# Patient Record
Sex: Male | Born: 1975
Health system: Southern US, Community
[De-identification: ages and names within clinical notes are randomized; demographics above are authoritative.]

## PROBLEM LIST (undated history)

## (undated) ENCOUNTER — Emergency Department (HOSPITAL_COMMUNITY): Discharge status: Absent without leave | Payer: BLUE CROSS/BLUE SHIELD

## (undated) DIAGNOSIS — E079 Disorder of thyroid, unspecified: Secondary | ICD-10-CM

## (undated) DIAGNOSIS — F319 Bipolar disorder, unspecified: Secondary | ICD-10-CM

## (undated) DIAGNOSIS — E785 Hyperlipidemia, unspecified: Secondary | ICD-10-CM

## (undated) DIAGNOSIS — F329 Major depressive disorder, single episode, unspecified: Secondary | ICD-10-CM

## (undated) DIAGNOSIS — E039 Hypothyroidism, unspecified: Secondary | ICD-10-CM

## (undated) DIAGNOSIS — F32A Depression, unspecified: Secondary | ICD-10-CM

## (undated) DIAGNOSIS — I10 Essential (primary) hypertension: Secondary | ICD-10-CM

## (undated) DIAGNOSIS — T7840XA Allergy, unspecified, initial encounter: Secondary | ICD-10-CM

## (undated) DIAGNOSIS — M1990 Unspecified osteoarthritis, unspecified site: Secondary | ICD-10-CM

## (undated) DIAGNOSIS — F1910 Other psychoactive substance abuse, uncomplicated: Secondary | ICD-10-CM

## (undated) DIAGNOSIS — F419 Anxiety disorder, unspecified: Secondary | ICD-10-CM

## (undated) HISTORY — DX: Disorder of thyroid, unspecified: E07.9

## (undated) HISTORY — DX: Allergy, unspecified, initial encounter: T78.40XA

## (undated) HISTORY — DX: Essential (primary) hypertension: I10

## (undated) HISTORY — DX: Hyperlipidemia, unspecified: E78.5

## (undated) HISTORY — DX: Bipolar disorder, unspecified: F31.9

## (undated) HISTORY — PX: SPINE SURGERY: SHX786

## (undated) HISTORY — PX: OTHER SURGICAL HISTORY: SHX169

## (undated) HISTORY — PX: CARPAL TUNNEL WITH CUBITAL TUNNEL: SHX5608

## (undated) HISTORY — PX: CERVICAL DISC ARTHROPLASTY: SHX587

## (undated) HISTORY — DX: Other psychoactive substance abuse, uncomplicated: F19.10

## (undated) HISTORY — PX: MOUTH SURGERY: SHX715

---
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
## 2009-06-23 ENCOUNTER — Ambulatory Visit: Payer: Self-pay | Admitting: Internal Medicine

## 2009-07-07 ENCOUNTER — Ambulatory Visit: Payer: Self-pay | Admitting: Internal Medicine

## 2009-08-06 ENCOUNTER — Ambulatory Visit: Payer: Self-pay | Admitting: Internal Medicine

## 2009-10-13 ENCOUNTER — Encounter: Admission: RE | Admit: 2009-10-13 | Discharge: 2009-10-13 | Payer: Self-pay | Admitting: Internal Medicine

## 2009-10-15 ENCOUNTER — Ambulatory Visit: Payer: Self-pay | Admitting: Internal Medicine

## 2010-04-20 ENCOUNTER — Ambulatory Visit: Payer: Self-pay | Admitting: Internal Medicine

## 2010-10-25 ENCOUNTER — Ambulatory Visit (HOSPITAL_COMMUNITY)
Admission: RE | Admit: 2010-10-25 | Discharge: 2010-10-25 | Payer: Self-pay | Source: Home / Self Care | Attending: Internal Medicine | Admitting: Internal Medicine

## 2010-10-25 ENCOUNTER — Encounter: Payer: Self-pay | Admitting: Internal Medicine

## 2010-10-25 ENCOUNTER — Ambulatory Visit: Payer: Self-pay | Admitting: Internal Medicine

## 2011-03-18 ENCOUNTER — Emergency Department (HOSPITAL_COMMUNITY): Payer: BC Managed Care – PPO

## 2011-03-18 ENCOUNTER — Emergency Department (HOSPITAL_COMMUNITY)
Admission: EM | Admit: 2011-03-18 | Discharge: 2011-03-18 | Disposition: A | Discharge status: Home / Self Care | Payer: BC Managed Care – PPO | Attending: Emergency Medicine | Admitting: Emergency Medicine

## 2011-03-18 DIAGNOSIS — S0100XA Unspecified open wound of scalp, initial encounter: Secondary | ICD-10-CM | POA: Insufficient documentation

## 2011-03-18 DIAGNOSIS — R748 Abnormal levels of other serum enzymes: Secondary | ICD-10-CM | POA: Insufficient documentation

## 2011-03-18 DIAGNOSIS — R296 Repeated falls: Secondary | ICD-10-CM | POA: Insufficient documentation

## 2011-03-18 DIAGNOSIS — F329 Major depressive disorder, single episode, unspecified: Secondary | ICD-10-CM | POA: Insufficient documentation

## 2011-03-18 DIAGNOSIS — R569 Unspecified convulsions: Secondary | ICD-10-CM | POA: Insufficient documentation

## 2011-03-18 DIAGNOSIS — Z79899 Other long term (current) drug therapy: Secondary | ICD-10-CM | POA: Insufficient documentation

## 2011-03-18 DIAGNOSIS — Y990 Civilian activity done for income or pay: Secondary | ICD-10-CM | POA: Insufficient documentation

## 2011-03-18 DIAGNOSIS — S0990XA Unspecified injury of head, initial encounter: Secondary | ICD-10-CM | POA: Insufficient documentation

## 2011-03-18 DIAGNOSIS — E785 Hyperlipidemia, unspecified: Secondary | ICD-10-CM | POA: Insufficient documentation

## 2011-03-18 DIAGNOSIS — E039 Hypothyroidism, unspecified: Secondary | ICD-10-CM | POA: Insufficient documentation

## 2011-03-18 DIAGNOSIS — R110 Nausea: Secondary | ICD-10-CM | POA: Insufficient documentation

## 2011-03-18 DIAGNOSIS — R413 Other amnesia: Secondary | ICD-10-CM | POA: Insufficient documentation

## 2011-03-18 DIAGNOSIS — I10 Essential (primary) hypertension: Secondary | ICD-10-CM | POA: Insufficient documentation

## 2011-03-18 DIAGNOSIS — F3289 Other specified depressive episodes: Secondary | ICD-10-CM | POA: Insufficient documentation

## 2011-03-18 LAB — URINALYSIS, ROUTINE W REFLEX MICROSCOPIC
Glucose, UA: NEGATIVE mg/dL
Ketones, ur: 15 mg/dL — AB
Leukocytes, UA: NEGATIVE
Nitrite: NEGATIVE
Specific Gravity, Urine: 1.033 — ABNORMAL HIGH (ref 1.005–1.030)
pH: 5.5 (ref 5.0–8.0)

## 2011-03-18 LAB — DIFFERENTIAL
Basophils Absolute: 0 10*3/uL (ref 0.0–0.1)
Basophils Relative: 0 % (ref 0–1)
Eosinophils Absolute: 0.2 10*3/uL (ref 0.0–0.7)
Monocytes Relative: 13 % — ABNORMAL HIGH (ref 3–12)
Neutro Abs: 6.2 10*3/uL (ref 1.7–7.7)
Neutrophils Relative %: 74 % (ref 43–77)

## 2011-03-18 LAB — COMPREHENSIVE METABOLIC PANEL
ALT: 26 U/L (ref 0–53)
AST: 61 U/L — ABNORMAL HIGH (ref 0–37)
Calcium: 10.4 mg/dL (ref 8.4–10.5)
Creatinine, Ser: 0.8 mg/dL (ref 0.4–1.5)
GFR calc Af Amer: >60 mL/min (ref >60)
Sodium: 138 mEq/L (ref 135–145)
Total Protein: 7.1 g/dL (ref 6.0–8.3)

## 2011-03-18 LAB — URINE MICROSCOPIC-ADD ON

## 2011-03-18 LAB — CBC
Hemoglobin: 14.2 g/dL (ref 13.0–17.0)
Platelets: 128 10*3/uL — ABNORMAL LOW (ref 150–400)
RBC: 4.39 MIL/uL (ref 4.22–5.81)
WBC: 8.5 10*3/uL (ref 4.0–10.5)

## 2011-03-18 LAB — RAPID URINE DRUG SCREEN, HOSP PERFORMED
Cocaine: NOT DETECTED
Tetrahydrocannabinol: NOT DETECTED

## 2011-03-18 LAB — GLUCOSE, CAPILLARY: Glucose-Capillary: 101 mg/dL — ABNORMAL HIGH (ref 70–99)

## 2011-03-18 LAB — MAGNESIUM: Magnesium: 2.3 mg/dL (ref 1.5–2.5)

## 2011-03-21 ENCOUNTER — Telehealth: Payer: Self-pay | Admitting: Internal Medicine

## 2011-03-21 NOTE — Telephone Encounter (Signed)
No- that is their time frame and actually that is quite good. Usually takes several weeks to get an apointment

## 2011-03-22 ENCOUNTER — Telehealth: Payer: Self-pay | Admitting: *Deleted

## 2011-03-22 NOTE — Telephone Encounter (Signed)
Left message for pt to call office.  MD wants pt to stop taking Wellbutrin if he had a seizure, since this medication has been associated with seizures.

## 2011-03-22 NOTE — Telephone Encounter (Signed)
Spoke with patient and he is no longer taking Wellbutrin.

## 2011-03-29 ENCOUNTER — Ambulatory Visit (INDEPENDENT_AMBULATORY_CARE_PROVIDER_SITE_OTHER): Payer: BC Managed Care – PPO | Admitting: Internal Medicine

## 2011-03-29 ENCOUNTER — Encounter: Payer: Self-pay | Admitting: Internal Medicine

## 2011-03-29 DIAGNOSIS — Z79899 Other long term (current) drug therapy: Secondary | ICD-10-CM

## 2011-03-29 DIAGNOSIS — G40909 Epilepsy, unspecified, not intractable, without status epilepticus: Secondary | ICD-10-CM

## 2011-03-29 DIAGNOSIS — E785 Hyperlipidemia, unspecified: Secondary | ICD-10-CM

## 2011-03-29 DIAGNOSIS — F329 Major depressive disorder, single episode, unspecified: Secondary | ICD-10-CM

## 2011-03-29 DIAGNOSIS — E039 Hypothyroidism, unspecified: Secondary | ICD-10-CM

## 2011-03-29 DIAGNOSIS — F32A Depression, unspecified: Secondary | ICD-10-CM | POA: Insufficient documentation

## 2011-03-29 DIAGNOSIS — I10 Essential (primary) hypertension: Secondary | ICD-10-CM

## 2011-03-29 LAB — LIPID PANEL
HDL: 45 mg/dL (ref >39)
Total CHOL/HDL Ratio: 3.7 Ratio
Triglycerides: 94 mg/dL (ref <150)

## 2011-03-29 LAB — HEPATIC FUNCTION PANEL
ALT: 18 U/L (ref 0–53)
AST: 28 U/L (ref 0–37)
Albumin: 4.4 g/dL (ref 3.5–5.2)
Alkaline Phosphatase: 74 U/L (ref 39–117)
Total Bilirubin: 0.3 mg/dL (ref 0.3–1.2)
Total Protein: 6.5 g/dL (ref 6.0–8.3)

## 2011-03-29 LAB — TSH: TSH: 4.027 u[IU]/mL (ref 0.350–4.500)

## 2011-03-29 NOTE — Progress Notes (Signed)
  Subjective:    Patient ID: Matthew Novak, male    DOB: [birth year], 35 y.o.   MRN: [id number]  HPI  Pt suffered a generalized seizure at work recently and was taken to ER where he was noted to have a laceration right parietal area that was repaired with staples. CT of brain was negative and he saw neurologist last week who has ordered an MRI and EEG in the mere future. He is not on any seizure meds at present. Last week, we discontinued his Wellbutrin after learning of his seizure. He is still on Lexapro. Also, is due for followup on hypothyroidism and hyperlipidemia so tsh, fasting lipid panel and liver functions drawn oday. Pt denies recent drug or alcohol use or ETOH withdrawal that would have led to seizure. No recent or remote head trauma.    Review of Systems     Objective:   Physical Exam   Right parietal area has several staples  with apprx well healed 3 inch laceration. Staples removed without difficulty .     Assessment & Plan:  1- New onset seizure disorder with laceration right parietal area secondary to striking head with seizure. Initial w/u negative. Neuro eval inprogress 2-HTN- well controlled on current regimen 3-Hyperlipidemia-fasting lipid panel and liver functions pending 4-Hypothyroidism-TSH pending 5-Hx depression and possible Bipolar disorder RTC in 6 months for CPE and fasting labs. Stop Wellbutri

## 2011-03-29 NOTE — Patient Instructions (Signed)
Stop Wellbutrin. Continue Lexapro. Continue thyroid replacement and statin therapy as well as anti-hypertensive meds. See in 6 months. Continue followup with Neurologist. No alcohol.

## 2011-03-30 ENCOUNTER — Other Ambulatory Visit: Payer: Self-pay

## 2011-03-30 MED ORDER — SYNTHROID 100 MCG PO TABS: 100.0000 ug | ORAL_TABLET | Freq: Every day | ORAL | Status: DC

## 2011-05-19 ENCOUNTER — Other Ambulatory Visit: Payer: Self-pay | Admitting: *Deleted

## 2011-05-19 MED ORDER — RAMIPRIL 5 MG PO CAPS: 5.0000 mg | ORAL_CAPSULE | Freq: Every day | ORAL | Status: DC

## 2011-05-19 MED ORDER — SIMVASTATIN 20 MG PO TABS
20.0000 mg | ORAL_TABLET | Freq: Every evening | ORAL | Status: DC
Start: 2011-05-19 — End: 2011-10-22

## 2011-07-06 ENCOUNTER — Other Ambulatory Visit: Payer: Self-pay

## 2011-07-06 MED ORDER — NAPROXEN 500 MG PO TABS: 500.0000 mg | ORAL_TABLET | Freq: Two times a day (BID) | ORAL | Status: DC | PRN

## 2011-07-14 ENCOUNTER — Other Ambulatory Visit: Payer: Self-pay | Admitting: *Deleted

## 2011-07-14 MED ORDER — ESCITALOPRAM OXALATE 10 MG PO TABS: 10.0000 mg | ORAL_TABLET | Freq: Every day | ORAL | Status: DC

## 2011-07-15 ENCOUNTER — Encounter: Payer: Self-pay | Admitting: Internal Medicine

## 2011-07-19 ENCOUNTER — Encounter: Payer: Self-pay | Admitting: Internal Medicine

## 2011-07-19 ENCOUNTER — Ambulatory Visit (INDEPENDENT_AMBULATORY_CARE_PROVIDER_SITE_OTHER): Payer: BC Managed Care – PPO | Admitting: Internal Medicine

## 2011-07-19 VITALS — BP 118/78 | HR 72 | Temp 98.0°F | Ht 69.0 in | Wt 179.0 lb

## 2011-07-19 DIAGNOSIS — I10 Essential (primary) hypertension: Secondary | ICD-10-CM

## 2011-07-19 DIAGNOSIS — F10239 Alcohol dependence with withdrawal, unspecified: Secondary | ICD-10-CM

## 2011-07-19 DIAGNOSIS — F1010 Alcohol abuse, uncomplicated: Secondary | ICD-10-CM

## 2011-07-19 DIAGNOSIS — R569 Unspecified convulsions: Secondary | ICD-10-CM

## 2011-07-19 DIAGNOSIS — F10939 Alcohol use, unspecified with withdrawal, unspecified: Secondary | ICD-10-CM

## 2011-07-19 DIAGNOSIS — E039 Hypothyroidism, unspecified: Secondary | ICD-10-CM

## 2011-07-19 DIAGNOSIS — E785 Hyperlipidemia, unspecified: Secondary | ICD-10-CM

## 2011-07-19 NOTE — Progress Notes (Signed)
  Subjective:    Patient ID: Matthew Novak, male    DOB: Aug 21, 1976, 35 y.o.   MRN: [id number]  HPI  patient with history of hypertension hyperlipidemia and hypothyroidism had tonic-clonic seizure 03/18/2011. He went to the emergency department and had a CT of the head and neck which were negative. His AST was elevated but other blood work was negative. Urine tested positive for amphetamines. Patient saw Dr.Dohmeier, neurologist 07/14/2011. He admitted that he has a drinking problem and has been consuming 2-4 drinks of rum at night. He apparently is allergic to contrast x-ray dye. Neurologist suggested an EEG and an MRI without contrast. He also had a sleep study recently that showed only mild apnea but he was intoxicated during his sleep study. He smokes a pack of cigarettes per week. 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 He is married and has one son. Wife works for [employer]. Patient also has been taking Lexapro for history of depression and possible bipolar disorder. He is on Zocor, Altase, Synthroid. Also at one point was on trazodone 100 mg at bedtime and 2010. Also at one point took Wellbutrin 300 mg daily. Most recent dose of Lexapro was 20 mg daily in 2010. Patient apparently tried to stop drinking but had some withdrawal symptoms. Neurologist has referred him to Dr. Sharyon Medicus. Neurologist says he can return to driving in early November. She counseled him at length about the need for entering alcoholics anonymous. 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 After neurologist call me last week, I asked patient and his wife to come see me today.  Wife was interviewed separately. Says that she did not realize until recently that this event, significant problem. She is concerned because her father also has alcohol dependency issues. Says finances are tight. She is stressed out herself. They have been together 17  years.  The in patient and his wife were interviewed together in an intervention was attempted. I spoke with him about the dangers of taking Klonopin which was prescribed by a neurologist for alcohol withdrawal symptoms and continuing to drink. He expressed my concern about his health and alcohol usage. In my opinion, he needs inpatient treatment with detox. He and his wife are going to check into their insurance plan and see if that is possible. My staff will make call to [facility] to see if a bed is available. Patient has not been to an AA meeting. Wife was referred to Al-Anon.    Review of Systems     Objective:   Physical Exam patient is not tremulous. Vital signs are stable. Chest clear, cardiac exam regular rate and rhythm. Patient admits to having one drink last night.        Assessment & Plan:  Alcohol abuse  Alcohol withdrawal seizure  Hypertension  Hyperlipidemia  Hypothyroidism  Family history of alcohol abuse in his father  Possible history of bipolar disorder formerly seen by Dr. Evelene Croon  Plan: Await callback from [facility] regarding inpatient treatment and from patient and his wife regarding substance abuse coverage with their insurance policy

## 2011-09-06 ENCOUNTER — Telehealth: Payer: Self-pay | Admitting: Internal Medicine

## 2011-09-06 NOTE — Telephone Encounter (Signed)
Please call patient re Klonopin question he has. He has hx ETOH abuse. Neurologist treated him recently for alcohol withdrawal and seizure.Marland Kitchen He was supposed to get some help with addiction. Not sure he followed through.

## 2011-09-07 NOTE — Telephone Encounter (Signed)
Patient states he has not had any ETOH since last ov with Dr. Lenord Fellers. Apparently quit cold [place] without any formal program or meetings

## 2011-09-12 ENCOUNTER — Ambulatory Visit (INDEPENDENT_AMBULATORY_CARE_PROVIDER_SITE_OTHER): Payer: BC Managed Care – PPO | Admitting: Internal Medicine

## 2011-09-12 ENCOUNTER — Encounter: Payer: Self-pay | Admitting: Internal Medicine

## 2011-09-12 VITALS — BP 112/68 | HR 68 | Temp 98.0°F | Ht 69.0 in | Wt 184.0 lb

## 2011-09-12 DIAGNOSIS — F32A Depression, unspecified: Secondary | ICD-10-CM

## 2011-09-12 DIAGNOSIS — E785 Hyperlipidemia, unspecified: Secondary | ICD-10-CM

## 2011-09-12 DIAGNOSIS — F329 Major depressive disorder, single episode, unspecified: Secondary | ICD-10-CM

## 2011-09-12 DIAGNOSIS — F419 Anxiety disorder, unspecified: Secondary | ICD-10-CM

## 2011-09-12 DIAGNOSIS — Z79899 Other long term (current) drug therapy: Secondary | ICD-10-CM

## 2011-09-12 DIAGNOSIS — F1010 Alcohol abuse, uncomplicated: Secondary | ICD-10-CM

## 2011-09-12 DIAGNOSIS — Z0000 Encounter for general adult medical examination without abnormal findings: Secondary | ICD-10-CM

## 2011-09-12 DIAGNOSIS — Z23 Encounter for immunization: Secondary | ICD-10-CM

## 2011-09-12 DIAGNOSIS — I10 Essential (primary) hypertension: Secondary | ICD-10-CM

## 2011-09-12 DIAGNOSIS — E039 Hypothyroidism, unspecified: Secondary | ICD-10-CM

## 2011-09-12 LAB — CBC WITH DIFFERENTIAL/PLATELET
Eosinophils Absolute: 0.4 10*3/uL (ref 0.0–0.7)
HCT: 44.5 % (ref 39.0–52.0)
Hemoglobin: 15 g/dL (ref 13.0–17.0)
Lymphs Abs: 2 10*3/uL (ref 0.7–4.0)
MCH: 30.1 pg (ref 26.0–34.0)
Monocytes Absolute: 0.7 10*3/uL (ref 0.1–1.0)
Monocytes Relative: 12 % (ref 3–12)
Neutro Abs: 2.8 10*3/uL (ref 1.7–7.7)
Neutrophils Relative %: 47 % (ref 43–77)
RBC: 4.98 MIL/uL (ref 4.22–5.81)

## 2011-09-12 LAB — TSH: TSH: 1.238 u[IU]/mL (ref 0.350–4.500)

## 2011-09-12 LAB — LIPID PANEL
Cholesterol: 166 mg/dL (ref 0–200)
HDL: 35 mg/dL — ABNORMAL LOW (ref >39)
Total CHOL/HDL Ratio: 4.7 Ratio
VLDL: 16 mg/dL (ref 0–40)

## 2011-09-12 LAB — POCT URINALYSIS DIPSTICK
Blood, UA: NEGATIVE
Nitrite, UA: NEGATIVE
Protein, UA: NEGATIVE
Spec Grav, UA: 1.01
Urobilinogen, UA: 0.2
pH, UA: 7.5

## 2011-09-12 LAB — COMPREHENSIVE METABOLIC PANEL
AST: 21 U/L (ref 0–37)
Albumin: 4.6 g/dL (ref 3.5–5.2)
Alkaline Phosphatase: 68 U/L (ref 39–117)
BUN: 15 mg/dL (ref 6–23)
Creat: 0.76 mg/dL (ref 0.50–1.35)
Glucose, Bld: 98 mg/dL (ref 70–99)
Potassium: 4.4 mEq/L (ref 3.5–5.3)
Total Bilirubin: 0.5 mg/dL (ref 0.3–1.2)

## 2011-09-12 NOTE — Progress Notes (Signed)
  Subjective:    Patient ID: Matthew Novak, male    DOB: November 03, 1976, 35 y.o.   MRN: [id number]  HPI 35 year old white male with history of hypertension, hyperlipidemia, hypothyroidism, anxiety depression, alcohol abuse for health maintenance. In may 2012, he had a one-time tonic-clonic seizure related to alcohol withdrawal. Was seen by a neurologist and was placed on Klonopin 2 mg one half tablet twice daily which she says helped greatly with anxiety. He did not seek help through Alcoholics Anonymous for [facility] is I suggested. Says he has stopped drinking and has not been consuming any alcohol now for several months. Says he gets anxious sometimes. Has trouble sleeping. Wife says sometimes he has a temper. Wants to be back on Klonopin. Says it helped his mood and help him to sleep. He is also on Lexapro.    Review of Systems  Constitutional: Negative.   HENT: Negative.   Eyes: Negative.   Cardiovascular: Negative.   Gastrointestinal: Negative.   Genitourinary: Negative.   Musculoskeletal: Negative.   Skin: Negative.   Neurological: Negative.   Hematological: Negative.   Psychiatric/Behavioral: Positive for sleep disturbance, dysphoric mood and agitation.       Objective:   Physical Exam  Vitals reviewed. Constitutional: He is oriented to person, place, and time. He appears well-developed and well-nourished.  HENT:  Head: Normocephalic and atraumatic.  Right Ear: External ear normal.  Left Ear: External ear normal.  Mouth/Throat: Oropharynx is clear and moist.  Eyes: Pupils are equal, round, and reactive to light.  Neck: Normal range of motion. Neck supple. No JVD present. No thyromegaly present.  Cardiovascular: Normal rate, regular rhythm and normal heart sounds.   No murmur heard. Pulmonary/Chest: Effort normal and breath sounds normal. He has no wheezes. He has no rales.  Abdominal: Soft. Bowel sounds are normal. He exhibits no mass. There is no tenderness.    Genitourinary:       Deferred  Musculoskeletal: Normal range of motion. He exhibits no edema.  Lymphadenopathy:    He has no cervical adenopathy.  Neurological: He is alert and oriented to person, place, and time. He has normal reflexes. No cranial nerve deficit. Coordination normal.  Skin: Skin is warm and dry.       Has a tattoo on each arm and each leg  Psychiatric: He has a normal mood and affect. Judgment and thought content normal.          Assessment & Plan:  Hypertension  Hyperlipidemia  Hypothyroidism  Anxiety depression  History of alcohol abuse with alcohol withdrawal seizure  Plan continue Lexapro 20 mg daily, fasting labs drawn and pending including TSH and fasting lipid panel. Prescription for Klonopin 1 mg #60 one by mouth twice daily with 2 refills. See him again in 6 months at which time she'll need fasting lipid panel liver functions and TSH along with office visit. Influenza immunization given today.

## 2011-09-12 NOTE — Patient Instructions (Signed)
Take Klonopin twice daily. Do not exceed that amount. Return in 6 months. Continue with medication for hypertension, hypothyroidism, hyperlipidemia. Continue with Lexapro.

## 2011-10-12 ENCOUNTER — Other Ambulatory Visit: Payer: Self-pay | Admitting: Internal Medicine

## 2011-10-22 ENCOUNTER — Other Ambulatory Visit: Payer: Self-pay | Admitting: Internal Medicine

## 2011-11-24 ENCOUNTER — Other Ambulatory Visit: Payer: Self-pay

## 2011-11-24 MED ORDER — SYNTHROID 100 MCG PO TABS: 100.0000 ug | ORAL_TABLET | Freq: Every day | ORAL | Status: DC

## 2012-01-06 ENCOUNTER — Encounter: Payer: Self-pay | Admitting: Internal Medicine

## 2012-01-06 ENCOUNTER — Ambulatory Visit (INDEPENDENT_AMBULATORY_CARE_PROVIDER_SITE_OTHER): Payer: BC Managed Care – PPO | Admitting: Internal Medicine

## 2012-01-06 DIAGNOSIS — E785 Hyperlipidemia, unspecified: Secondary | ICD-10-CM

## 2012-01-06 DIAGNOSIS — F419 Anxiety disorder, unspecified: Secondary | ICD-10-CM

## 2012-01-06 DIAGNOSIS — E039 Hypothyroidism, unspecified: Secondary | ICD-10-CM

## 2012-01-06 DIAGNOSIS — Z79899 Other long term (current) drug therapy: Secondary | ICD-10-CM

## 2012-01-06 DIAGNOSIS — R569 Unspecified convulsions: Secondary | ICD-10-CM

## 2012-01-06 DIAGNOSIS — F411 Generalized anxiety disorder: Secondary | ICD-10-CM

## 2012-01-06 DIAGNOSIS — I10 Essential (primary) hypertension: Secondary | ICD-10-CM

## 2012-01-06 DIAGNOSIS — F1011 Alcohol abuse, in remission: Secondary | ICD-10-CM

## 2012-01-06 DIAGNOSIS — F10239 Alcohol dependence with withdrawal, unspecified: Secondary | ICD-10-CM

## 2012-01-06 LAB — HEPATIC FUNCTION PANEL
Albumin: 4.3 g/dL (ref 3.5–5.2)
Alkaline Phosphatase: 74 U/L (ref 39–117)
Bilirubin, Direct: 0.1 mg/dL (ref 0.0–0.3)
Indirect Bilirubin: 0.2 mg/dL (ref 0.0–0.9)
Total Bilirubin: 0.3 mg/dL (ref 0.3–1.2)

## 2012-01-06 LAB — LIPID PANEL: LDL Cholesterol: 112 mg/dL — ABNORMAL HIGH (ref 0–99)

## 2012-01-08 NOTE — Patient Instructions (Signed)
Discontinue Klonopin. Take Valium 1/2-1 tablet by mouth twice daily as needed for anxiety. Do not stop Valium abruptly. Continue [place] Zocor. Continue trazodone. Return in 6 months for physical exam or sooner if necessary.

## 2012-01-08 NOTE — Progress Notes (Signed)
  Subjective:    Patient ID: Matthew Novak, male    DOB: 19-Nov-1975, 36 y.o.   MRN: [id number]  HPI Patient says Scarlette [place] is making him very drowsy. He wants to come off of that. However it seems to been successful in controlling anxiety. Says he's not had an alcoholic drink in 6 months. Does have history of alcohol withdrawal seizure. 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 He also has a history of hypertension and hypothyroidism as well as hyperlipidemia. He says he's previously been diagnosed with bipolar disorder. He is on Zocor, Altase, Lexapro, Synthroid and Klonopin 1 mg twice daily. Started on Klonopin 1 mg by mouth twice daily November 2012. Diagnosed with hypothyroidism in 2010. Diagnosed with hypertension in 2010. Is also taking trazodone 100 mg at bedtime. Says he has trouble getting up and going to work in the mornings because of extreme drowsiness. Trazodone may be contributing to that as well.    Review of Systems     Objective:   Physical Exam chest clear to auscultation. Cardiac exam regular rate and rhythm normal S1 and S2. Neck is supple without thyromegaly.        Assessment & Plan:  History of alcohol abuse  History of alcohol withdrawal seizure  History of anxiety  Hypertension  Hypothyroidism  Hyperlipidemia  Plan: Fasting lab work drawn today including lipid panel liver functions and TSH. Patient really wants to come off of Klonopin. I think it would be a mistake just to stop that abruptly. I do think he probably benefits from some anti-anxiety medication. Have prescribed Valium 5 mg by mouth twice daily. Suggested maybe just taking one half to one tablet in the morning and maybe not at night. Trazodone may be contributing to drowsiness in the morning as well but I think it helps with mood stabilization. He is to return in 6 months for physical exam.

## 2012-02-29 ENCOUNTER — Other Ambulatory Visit: Payer: Self-pay

## 2012-02-29 MED ORDER — DIAZEPAM 5 MG PO TABS: 5.0000 mg | ORAL_TABLET | Freq: Two times a day (BID) | ORAL | Status: AC | PRN

## 2012-03-12 ENCOUNTER — Ambulatory Visit: Payer: BC Managed Care – PPO | Admitting: Internal Medicine

## 2012-03-16 ENCOUNTER — Other Ambulatory Visit: Payer: Self-pay | Admitting: Internal Medicine

## 2012-03-29 ENCOUNTER — Other Ambulatory Visit: Payer: Self-pay | Admitting: Internal Medicine

## 2012-03-30 ENCOUNTER — Other Ambulatory Visit: Payer: Self-pay

## 2012-03-30 MED ORDER — SIMVASTATIN 20 MG PO TABS: 20.0000 mg | ORAL_TABLET | Freq: Every day | ORAL | Status: DC

## 2012-08-05 ENCOUNTER — Other Ambulatory Visit: Payer: Self-pay | Admitting: Internal Medicine

## 2012-08-06 ENCOUNTER — Other Ambulatory Visit: Payer: Self-pay

## 2012-08-06 MED ORDER — SYNTHROID 100 MCG PO TABS: 100.0000 ug | ORAL_TABLET | Freq: Every day | ORAL | Status: DC

## 2012-08-06 MED ORDER — ESCITALOPRAM OXALATE 10 MG PO TABS: 10.0000 mg | ORAL_TABLET | Freq: Every day | ORAL | Status: DC

## 2012-08-16 ENCOUNTER — Other Ambulatory Visit: Payer: Self-pay | Admitting: Internal Medicine

## 2012-09-03 ENCOUNTER — Other Ambulatory Visit: Payer: Self-pay

## 2012-09-03 MED ORDER — NAPROXEN 500 MG PO TABS: 500.0000 mg | ORAL_TABLET | Freq: Two times a day (BID) | ORAL | Status: DC | PRN

## 2012-09-17 ENCOUNTER — Other Ambulatory Visit: Payer: BC Managed Care – PPO | Admitting: Internal Medicine

## 2012-09-18 ENCOUNTER — Encounter: Payer: BC Managed Care – PPO | Admitting: Internal Medicine

## 2012-10-14 ENCOUNTER — Other Ambulatory Visit: Payer: Self-pay | Admitting: Internal Medicine

## 2012-10-14 NOTE — Telephone Encounter (Signed)
Refill for 30 days and see when OV is due please

## 2012-10-15 ENCOUNTER — Other Ambulatory Visit: Payer: Self-pay

## 2012-10-15 MED ORDER — DIAZEPAM 5 MG PO TABS: 5.0000 mg | ORAL_TABLET | Freq: Two times a day (BID) | ORAL | Status: DC | PRN

## 2012-10-25 ENCOUNTER — Other Ambulatory Visit: Payer: Self-pay | Admitting: Internal Medicine

## 2012-10-25 NOTE — Telephone Encounter (Signed)
We recently refilled. Please check on this and when is appt due

## 2012-10-25 NOTE — Telephone Encounter (Signed)
Left message on patient's cell  Phone to return our call

## 2012-11-02 NOTE — Telephone Encounter (Signed)
Have not heard from patient yet. Left another voicemail message.

## 2012-11-15 ENCOUNTER — Other Ambulatory Visit: Payer: Self-pay

## 2012-11-15 MED ORDER — DIAZEPAM 5 MG PO TABS: 5.0000 mg | ORAL_TABLET | Freq: Two times a day (BID) | ORAL | Status: DC | PRN

## 2012-11-29 ENCOUNTER — Encounter: Payer: BC Managed Care – PPO | Admitting: Internal Medicine

## 2013-01-04 ENCOUNTER — Ambulatory Visit (INDEPENDENT_AMBULATORY_CARE_PROVIDER_SITE_OTHER): Payer: BC Managed Care – PPO | Admitting: Internal Medicine

## 2013-01-04 ENCOUNTER — Encounter: Payer: Self-pay | Admitting: Internal Medicine

## 2013-01-04 ENCOUNTER — Other Ambulatory Visit: Payer: Self-pay | Admitting: Internal Medicine

## 2013-01-04 VITALS — BP 98/70 | HR 76 | Temp 97.8°F | Ht 70.25 in | Wt 176.5 lb

## 2013-01-04 DIAGNOSIS — F419 Anxiety disorder, unspecified: Secondary | ICD-10-CM

## 2013-01-04 DIAGNOSIS — Z0000 Encounter for general adult medical examination without abnormal findings: Secondary | ICD-10-CM

## 2013-01-04 DIAGNOSIS — F1021 Alcohol dependence, in remission: Secondary | ICD-10-CM

## 2013-01-04 DIAGNOSIS — E785 Hyperlipidemia, unspecified: Secondary | ICD-10-CM

## 2013-01-04 DIAGNOSIS — R569 Unspecified convulsions: Secondary | ICD-10-CM

## 2013-01-04 DIAGNOSIS — E039 Hypothyroidism, unspecified: Secondary | ICD-10-CM

## 2013-01-04 DIAGNOSIS — F341 Dysthymic disorder: Secondary | ICD-10-CM

## 2013-01-04 DIAGNOSIS — I10 Essential (primary) hypertension: Secondary | ICD-10-CM

## 2013-01-04 DIAGNOSIS — F32A Depression, unspecified: Secondary | ICD-10-CM

## 2013-01-04 DIAGNOSIS — F10239 Alcohol dependence with withdrawal, unspecified: Secondary | ICD-10-CM

## 2013-01-04 LAB — LIPID PANEL
HDL: 40 mg/dL (ref >39)
LDL Cholesterol: 106 mg/dL — ABNORMAL HIGH (ref 0–99)
Total CHOL/HDL Ratio: 3.9 Ratio

## 2013-01-04 LAB — POCT URINALYSIS DIPSTICK
Blood, UA: NEGATIVE
Nitrite, UA: NEGATIVE
Spec Grav, UA: 1.015
Urobilinogen, UA: NEGATIVE
pH, UA: 7

## 2013-01-04 LAB — CBC WITH DIFFERENTIAL/PLATELET
Hemoglobin: 15.2 g/dL (ref 13.0–17.0)
Lymphocytes Relative: 37 % (ref 12–46)
Lymphs Abs: 2.1 10*3/uL (ref 0.7–4.0)
MCH: 29.5 pg (ref 26.0–34.0)
MCV: 84.7 fL (ref 78.0–100.0)
Monocytes Relative: 11 % (ref 3–12)
Neutrophils Relative %: 43 % (ref 43–77)
Platelets: 248 10*3/uL (ref 150–400)
RBC: 5.15 MIL/uL (ref 4.22–5.81)
WBC: 5.7 10*3/uL (ref 4.0–10.5)

## 2013-01-04 LAB — COMPREHENSIVE METABOLIC PANEL
AST: 18 U/L (ref 0–37)
Alkaline Phosphatase: 60 U/L (ref 39–117)
BUN: 16 mg/dL (ref 6–23)
CO2: 26 mEq/L (ref 19–32)
Chloride: 105 mEq/L (ref 96–112)
Creat: 0.89 mg/dL (ref 0.50–1.35)
Sodium: 140 mEq/L (ref 135–145)
Total Bilirubin: 0.5 mg/dL (ref 0.3–1.2)

## 2013-01-04 LAB — TSH: TSH: 2.008 u[IU]/mL (ref 0.350–4.500)

## 2013-02-18 ENCOUNTER — Encounter: Payer: Self-pay | Admitting: Internal Medicine

## 2013-02-18 ENCOUNTER — Ambulatory Visit (INDEPENDENT_AMBULATORY_CARE_PROVIDER_SITE_OTHER): Payer: Self-pay | Admitting: Internal Medicine

## 2013-02-18 VITALS — BP 116/80 | HR 76 | Temp 98.9°F | Wt 174.0 lb

## 2013-02-18 DIAGNOSIS — Z8679 Personal history of other diseases of the circulatory system: Secondary | ICD-10-CM

## 2013-02-18 DIAGNOSIS — E785 Hyperlipidemia, unspecified: Secondary | ICD-10-CM

## 2013-02-18 DIAGNOSIS — E291 Testicular hypofunction: Secondary | ICD-10-CM

## 2013-02-18 DIAGNOSIS — R5381 Other malaise: Secondary | ICD-10-CM

## 2013-02-18 DIAGNOSIS — F1011 Alcohol abuse, in remission: Secondary | ICD-10-CM

## 2013-02-18 DIAGNOSIS — E039 Hypothyroidism, unspecified: Secondary | ICD-10-CM

## 2013-02-18 DIAGNOSIS — R5383 Other fatigue: Secondary | ICD-10-CM

## 2013-02-18 DIAGNOSIS — R7989 Other specified abnormal findings of blood chemistry: Secondary | ICD-10-CM

## 2013-02-18 DIAGNOSIS — F1911 Other psychoactive substance abuse, in remission: Secondary | ICD-10-CM

## 2013-02-18 LAB — LIPID PANEL
LDL Cholesterol: 149 mg/dL — ABNORMAL HIGH (ref 0–99)
Triglycerides: 121 mg/dL (ref <150)

## 2013-02-19 LAB — TESTOSTERONE: Testosterone: 252 ng/dL — ABNORMAL LOW (ref 300–890)

## 2013-02-26 ENCOUNTER — Other Ambulatory Visit: Payer: Self-pay | Admitting: Internal Medicine

## 2013-02-27 ENCOUNTER — Other Ambulatory Visit: Payer: Self-pay

## 2013-03-09 ENCOUNTER — Encounter: Payer: Self-pay | Admitting: Internal Medicine

## 2013-03-09 DIAGNOSIS — F1021 Alcohol dependence, in remission: Secondary | ICD-10-CM | POA: Insufficient documentation

## 2013-03-09 DIAGNOSIS — R7989 Other specified abnormal findings of blood chemistry: Secondary | ICD-10-CM | POA: Insufficient documentation

## 2013-03-09 NOTE — Patient Instructions (Addendum)
Patient may hold off on TSH and lipid-lowering medication and return in 2 months and have these rechecked. Continue antihypertensive medication.

## 2013-03-09 NOTE — Progress Notes (Signed)
  Subjective:    Patient ID: Matthew Novak, male    DOB: [birth year], 37 y.o.   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 Patient has a history of drug and alcohol abuse but he is in recovery. Has a history of anxiety and depression. At last visit in February he had discontinued taking thyroid replacement medication and lipid-lowering medication. He wanted to return and have these checked in a couple of months to see if they were stable. Is also asking to have his testosterone checked. History of anxiety for which he takes when necessary Valium. Is under some stress with business.    Review of Systems     Objective:   Physical Exam neck is supple without thyromegaly. Chest clear to auscultation. Cardiac exam regular rate and rhythm normal S1 and S2. Extremities without edema        Assessment & Plan:  Fatigue wants to have a testosterone checked  History of hypothyroidism but recent thyroid functions were normal  History of hypertension but has discontinued Ramapril. Since he has stopped drinking he may not need antihypertensive medication. Continue to monitor.  Hyperlipidemia-has stopped cholesterol lowering medication.  History of alcohol withdrawal seizure  Plan: Return in 6 months for followup. If testosterone level is low, refer to urologist  Addendum: Testosterone level is low. Refer to urology

## 2013-03-09 NOTE — Progress Notes (Signed)
Subjective:    Patient ID: Matthew Novak, male    DOB: 17-May-1976, 37 y.o.   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 He has a history of drug and alcohol abuse. He has had 1 alcohol withdrawal seizure in the past. Neurological exam and workup were negative. Had sleep study after alcohol withdrawal seizure but apparently was intoxicated at the time of the study and had some mild apnea. Takes occasional Valium for anxiety. Takes Lexapro for anxiety and depression. History of hypertension for which he takes Altace. Has not been taking thyroid replacement for hypothyroidism nor statin medication for history of hyperlipidemia. Wants to have labs checked and see how these values are off medication. Says he's not been drinking or consume illicit drugs. Feels that depression is stable. He is married has one son. Son has diabetes mellitus. He has had a fair amount of stress on the job because his brother-in-law who also works there has been out due to injury and his father-in-law has had prostate cancer surgery. 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 His mother-in-law helps with bookkeeping.  Has been a patient here since 2010. At that time he presented here he was on Lexapro and Wellbutrin. He had noticed his blood pressure had been elevated. Says that he had seen Dr. Lafayette Dragon, psychiatrist and been diagnosed with bipolar disorder.  Smokes about half pack cigarettes daily. Has smoked for over 20 years.  Is allergic to x-ray contrast dye.  History of tendinitis and reassessed in the past and right shoulder arthropathy.  Family history: Father died at age 64 of ALS with history of MI and hypertension. Mother in her 77s with history of hypertension. Brother in his 66s with history of hypertension.        Review of Systems  Constitutional: Negative.   Psychiatric/Behavioral:       History of  depression and anxiety  All other systems reviewed and are negative.       Objective:   Physical Exam  Vitals reviewed. Constitutional: He is oriented to person, place, and time. He appears well-developed and well-nourished. No distress.  HENT:  Head: Normocephalic and atraumatic.  Right Ear: External ear normal.  Left Ear: External ear normal.  Mouth/Throat: No oropharyngeal exudate.  Eyes: Conjunctivae and EOM are normal. Pupils are equal, round, and reactive to light. Right eye exhibits no discharge. Left eye exhibits no discharge. No scleral icterus.  Neck: Neck supple. No JVD present. No thyromegaly present.  Cardiovascular: Normal rate, regular rhythm, normal heart sounds and intact distal pulses.   No murmur heard. Pulmonary/Chest: Effort normal and breath sounds normal. He has no rales.  Abdominal: Soft. He exhibits no distension and no mass. There is no tenderness. There is no rebound and no guarding.  Musculoskeletal: Normal range of motion. He exhibits no edema.  Lymphadenopathy:    He has no cervical adenopathy.  Neurological: He is alert and oriented to person, place, and time. He has normal reflexes. No cranial nerve deficit.  Skin: Skin is warm and dry. No rash noted.  Piercing beneath lower lip  Psychiatric: He has a normal mood and affect. His behavior is normal. Judgment and thought content normal.          Assessment & Plan:  History of drug and alcohol abuse in 2012 , is in  recovery and appears to be stable  History  of alcohol withdrawal seizure 2012  Hypertension- stable on Altace  History of hyperlipidemia- not taking statin  History of hypothyroidism- not taking thyroid replacement  History of anxiety depression- taking Lexapro  Plan: Fasting labs are drawn and are pending. Further recommendations to follow based on labs. He's not taking Synthroid nor statin medication. He is taking antihypertensive medication. He is taking Lexapro and occasional  Valium  Addendum: Interestingly, TSH is normal off Synthroid and lipid panel is essentially normal off statin medication. Continue antihypertensive medication and Lexapro as well as when necessary value. He is to return in a couple of months and have TSH and lipid panel repeated.

## 2013-03-11 NOTE — Patient Instructions (Addendum)
Will be referred to urology for low testosterone level. Stay off antihypertensive medication, thyroid medication and lipid-lowering medication and return in 6 months for recheck

## 2013-05-03 ENCOUNTER — Other Ambulatory Visit: Payer: Self-pay | Admitting: Internal Medicine

## 2013-05-03 NOTE — Telephone Encounter (Signed)
Refill for 30 days  

## 2013-06-24 ENCOUNTER — Other Ambulatory Visit: Payer: Self-pay | Admitting: Internal Medicine

## 2013-06-25 NOTE — Telephone Encounter (Signed)
Refill once 

## 2013-07-13 ENCOUNTER — Other Ambulatory Visit: Payer: Self-pay | Admitting: Internal Medicine

## 2013-08-06 ENCOUNTER — Other Ambulatory Visit: Payer: Self-pay | Admitting: Internal Medicine

## 2013-08-06 NOTE — Telephone Encounter (Signed)
Refill once 

## 2013-08-30 ENCOUNTER — Other Ambulatory Visit: Payer: Self-pay | Admitting: Internal Medicine

## 2013-10-02 ENCOUNTER — Other Ambulatory Visit: Payer: Self-pay | Admitting: Internal Medicine

## 2013-10-02 ENCOUNTER — Telehealth: Payer: Self-pay | Admitting: Internal Medicine

## 2013-10-02 NOTE — Telephone Encounter (Signed)
Received refill request for Valium generic 5 mg one by mouth twice daily as needed. Called in #60 no refill to [facility]. Patient last seen April 2014. Needs followup visit soon.

## 2013-10-09 NOTE — Telephone Encounter (Signed)
Left message for patient to call me to confirm a 6 month f/u appointment (med check).  Tentatively given appt for 11/15/13 @ 9:45 a.m. And asked that he return my call to confirm date/time.  No additional refills until he is seen for 6 month f/u.

## 2013-11-07 ENCOUNTER — Other Ambulatory Visit: Payer: Self-pay | Admitting: Internal Medicine

## 2013-11-14 ENCOUNTER — Telehealth: Payer: Self-pay | Admitting: Internal Medicine

## 2013-11-14 NOTE — Telephone Encounter (Signed)
Requested patient to please contact me at his earliest convenience to book CPE in March.  Patient verbalized understanding of this request.

## 2013-12-04 ENCOUNTER — Other Ambulatory Visit: Payer: Self-pay | Admitting: Internal Medicine

## 2013-12-16 ENCOUNTER — Encounter: Payer: Self-pay | Admitting: Internal Medicine

## 2013-12-16 ENCOUNTER — Ambulatory Visit (INDEPENDENT_AMBULATORY_CARE_PROVIDER_SITE_OTHER): Payer: BC Managed Care – PPO | Admitting: Internal Medicine

## 2013-12-16 VITALS — BP 120/84 | HR 76 | Temp 98.4°F | Wt 162.5 lb

## 2013-12-16 DIAGNOSIS — F32A Depression, unspecified: Secondary | ICD-10-CM

## 2013-12-16 DIAGNOSIS — F329 Major depressive disorder, single episode, unspecified: Secondary | ICD-10-CM

## 2013-12-16 DIAGNOSIS — F3289 Other specified depressive episodes: Secondary | ICD-10-CM

## 2013-12-16 DIAGNOSIS — G4700 Insomnia, unspecified: Secondary | ICD-10-CM | POA: Insufficient documentation

## 2013-12-16 DIAGNOSIS — F411 Generalized anxiety disorder: Secondary | ICD-10-CM

## 2013-12-16 MED ORDER — DIAZEPAM 5 MG PO TABS: ORAL_TABLET | ORAL | Status: DC

## 2013-12-16 MED ORDER — ESCITALOPRAM OXALATE 10 MG PO TABS: ORAL_TABLET | ORAL | Status: DC

## 2013-12-16 MED ORDER — QUETIAPINE FUMARATE 50 MG PO TABS: 50.0000 mg | ORAL_TABLET | Freq: Every day | ORAL | Status: DC

## 2013-12-16 NOTE — Patient Instructions (Signed)
Takes Seroquel 50 mg at bedtime and call if not sleeping better in 2 weeks. Watch him out of bag of you are taking. Please just take on an as-needed basis. Continue Lexapro. But physical exam in the near future. Thyroid functions are pending.

## 2013-12-16 NOTE — Progress Notes (Addendum)
   Subjective:    Patient ID: [name], male    DOB: 02/22/1976, 38 y.o.   MRN: [id number]  HPI  Job at [employer] is stressful. Several family members have been out on medical leave and unable to work. He has several full-time employees as well. Taking Valium 10 mg twice a day on a daily basis. I am the and who is here that he is to see Dr. Robina Ade for medication a bit concerned about that. He was just supposed to take it on a when necessary basis. Told him I was afraid he might get addicted to Valium and that was disconcerting to me. He took one of his wife's Tylox for some neck pain at night because he complains of tension in his neck. Says he does not sleep well and doesn't ever feel rested. Told him I would not be willing to give him narcotic pain medication for neck and shoulder pain at this point. Reminded him that value was also a muscle relaxant. He is to see Dr.Kaur for medication management. Says he tried several medicines for depression and bipolar disorder but he did not feel that worked very well. He's on Lexapro currently and does fairly well with that. Not taking Synthroid. Does have history of elevated TSH but last time we checked it it was normal off Synthroid. Says he continues to take statin medication. Needs to put physical exam in the near future. We did draw free T4 and TSH today with complaint of anxiety agitation and insomnia.    Review of Systems     Objective:   Physical Exam  Neck is supple without thyromegaly. Chest clear to auscultation. Cardiac exam regular rate and rhythm normal S1 and S2. Extremities without edema      Assessment & Plan:  Anxiety  History of depression  Insomnia  Musculoskeletal neck and shoulder pain  Plan: Seroquel 50 mg at bedtime. Refill Valium 10 mg( #60) one by mouth twice daily as needed with one refill. Refill Lexapro. Book physical examination Spring 2015. Thyroid functions are pending.

## 2013-12-17 LAB — TSH: TSH: 4.482 u[IU]/mL (ref 0.350–4.500)

## 2013-12-17 LAB — T4, FREE: Free T4: 1.33 ng/dL (ref 0.80–1.80)

## 2013-12-19 ENCOUNTER — Other Ambulatory Visit: Payer: Self-pay

## 2013-12-19 MED ORDER — LEVOTHYROXINE SODIUM 50 MCG PO TABS: 50.0000 ug | ORAL_TABLET | Freq: Every day | ORAL | Status: DC

## 2013-12-19 NOTE — Progress Notes (Signed)
Patient informed. 

## 2014-01-16 ENCOUNTER — Other Ambulatory Visit: Payer: Self-pay | Admitting: Internal Medicine

## 2014-01-17 ENCOUNTER — Other Ambulatory Visit: Payer: Self-pay

## 2014-01-17 MED ORDER — QUETIAPINE FUMARATE 50 MG PO TABS: 50.0000 mg | ORAL_TABLET | Freq: Every day | ORAL | Status: DC

## 2014-01-17 NOTE — Telephone Encounter (Signed)
Started on Seroquel 12/16/13. Please call him and see how it is working.

## 2014-01-17 NOTE — Telephone Encounter (Signed)
Patient reports that Seroquel is working great to help him sleep. OK to refill for one month per Dr. Renold Genta

## 2014-02-11 ENCOUNTER — Other Ambulatory Visit: Payer: Self-pay | Admitting: Internal Medicine

## 2014-03-03 ENCOUNTER — Encounter: Payer: Self-pay | Admitting: Internal Medicine

## 2014-03-03 ENCOUNTER — Ambulatory Visit (INDEPENDENT_AMBULATORY_CARE_PROVIDER_SITE_OTHER): Payer: BC Managed Care – PPO | Admitting: Internal Medicine

## 2014-03-03 VITALS — BP 110/78 | HR 76 | Temp 97.4°F | Ht 69.5 in | Wt 164.5 lb

## 2014-03-03 DIAGNOSIS — F419 Anxiety disorder, unspecified: Secondary | ICD-10-CM

## 2014-03-03 DIAGNOSIS — Z87891 Personal history of nicotine dependence: Secondary | ICD-10-CM

## 2014-03-03 DIAGNOSIS — Z0000 Encounter for general adult medical examination without abnormal findings: Secondary | ICD-10-CM

## 2014-03-03 DIAGNOSIS — F329 Major depressive disorder, single episode, unspecified: Secondary | ICD-10-CM

## 2014-03-03 DIAGNOSIS — E039 Hypothyroidism, unspecified: Secondary | ICD-10-CM | POA: Insufficient documentation

## 2014-03-03 DIAGNOSIS — Z130 Encounter for screening for diseases of the blood and blood-forming organs and certain disorders involving the immune mechanism: Secondary | ICD-10-CM

## 2014-03-03 DIAGNOSIS — I10 Essential (primary) hypertension: Secondary | ICD-10-CM

## 2014-03-03 DIAGNOSIS — F341 Dysthymic disorder: Secondary | ICD-10-CM

## 2014-03-03 DIAGNOSIS — F32A Depression, unspecified: Secondary | ICD-10-CM

## 2014-03-03 DIAGNOSIS — Z13220 Encounter for screening for lipoid disorders: Secondary | ICD-10-CM

## 2014-03-03 DIAGNOSIS — G4700 Insomnia, unspecified: Secondary | ICD-10-CM

## 2014-03-03 LAB — COMPREHENSIVE METABOLIC PANEL
ALBUMIN: 4.4 g/dL (ref 3.5–5.2)
ALK PHOS: 53 U/L (ref 39–117)
ALT: 14 U/L (ref 0–53)
AST: 24 U/L (ref 0–37)
BUN: 11 mg/dL (ref 6–23)
CALCIUM: 9.5 mg/dL (ref 8.4–10.5)
CO2: 28 mEq/L (ref 19–32)
CREATININE: 0.84 mg/dL (ref 0.50–1.35)
Chloride: 106 mEq/L (ref 96–112)
Glucose, Bld: 113 mg/dL — ABNORMAL HIGH (ref 70–99)
POTASSIUM: 4.6 meq/L (ref 3.5–5.3)
Sodium: 141 mEq/L (ref 135–145)
Total Bilirubin: 0.5 mg/dL (ref 0.2–1.2)
Total Protein: 6.2 g/dL (ref 6.0–8.3)

## 2014-03-03 LAB — LIPID PANEL
CHOL/HDL RATIO: 3.5 ratio
Cholesterol: 149 mg/dL (ref 0–200)
HDL: 42 mg/dL (ref >39)
LDL CALC: 95 mg/dL (ref 0–99)
TRIGLYCERIDES: 62 mg/dL (ref <150)
VLDL: 12 mg/dL (ref 0–40)

## 2014-03-03 LAB — POCT URINALYSIS DIPSTICK
BILIRUBIN UA: NEGATIVE
Glucose, UA: NEGATIVE
Ketones, UA: NEGATIVE
Leukocytes, UA: NEGATIVE
Nitrite, UA: NEGATIVE
PH UA: 6.5
PROTEIN UA: NEGATIVE
RBC UA: NEGATIVE
Spec Grav, UA: 1.015
Urobilinogen, UA: NEGATIVE

## 2014-03-03 LAB — CBC WITH DIFFERENTIAL/PLATELET
BASOS ABS: 0.1 10*3/uL (ref 0.0–0.1)
BASOS PCT: 1 % (ref 0–1)
EOS PCT: 9 % — AB (ref 0–5)
Eosinophils Absolute: 0.5 10*3/uL (ref 0.0–0.7)
HEMATOCRIT: 43.8 % (ref 39.0–52.0)
HEMOGLOBIN: 15.1 g/dL (ref 13.0–17.0)
Lymphocytes Relative: 39 % (ref 12–46)
Lymphs Abs: 2.2 10*3/uL (ref 0.7–4.0)
MCH: 30 pg (ref 26.0–34.0)
MCHC: 34.5 g/dL (ref 30.0–36.0)
MCV: 86.9 fL (ref 78.0–100.0)
MONOS PCT: 11 % (ref 3–12)
Monocytes Absolute: 0.6 10*3/uL (ref 0.1–1.0)
Neutro Abs: 2.2 10*3/uL (ref 1.7–7.7)
Neutrophils Relative %: 40 % — ABNORMAL LOW (ref 43–77)
Platelets: 224 10*3/uL (ref 150–400)
RBC: 5.04 MIL/uL (ref 4.22–5.81)
RDW: 13.8 % (ref 11.5–15.5)
WBC: 5.6 10*3/uL (ref 4.0–10.5)

## 2014-03-03 LAB — TSH: TSH: 0.598 u[IU]/mL (ref 0.350–4.500)

## 2014-03-03 NOTE — Patient Instructions (Signed)
Continue same medications and return in 6 months. Labs are pending.

## 2014-03-03 NOTE — Progress Notes (Signed)
Subjective:    Patient ID: Matthew Novak, male    DOB: May 29, 1976, 38 y.o.   MRN: [id number]  HPI  38 year old white male in today for health maintenance exam and evaluation of medical issues. In February, TSH was elevated once again and we convinced him to restart thyroid replacement therapy. Currently on Levothroid 0.05 mg daily for fasting labs pending. He has a history of drug and alcohol abuse but has done well recently. He had 1 alcohol withdrawal seizure in the remote past. His neurological exam and workup were negative at that time. This takes Lexapro and Seroquel. Doing well with that regimen. History of hypertension for which she takes Altase. History of tendinitis and right shoulder arthropathy but currently not an issue.  Social history: He is married and has one son 10 years of age. 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 Has a fair amount of stress on the job. Smokes about half pack cigarettes daily. He smoked for over 20 years.  Is allergic to x-ray contrast dye.  Family history: Father died at age 25 of ALS with history of MI and hypertension. Mother living with history of hypertension. Brother living with history of hypertension.    Review of Systems  Constitutional: Negative.   HENT: Negative.   Eyes: Negative.   Respiratory: Negative.   Cardiovascular: Negative.   Gastrointestinal: Negative.   Endocrine:       History of hypothyroidism  Genitourinary:       History of low testosterone  Allergic/Immunologic: Negative.   Psychiatric/Behavioral:       Situational stress with job. History of insomnia doing better with Seroquel       Objective:   Physical Exam  Vitals reviewed. Constitutional: He is oriented to person, place, and time. He appears well-developed and well-nourished.  HENT:  Head: Normocephalic and atraumatic.  Right Ear: External ear normal.  Left Ear: External ear normal.    Mouth/Throat: Oropharynx is clear and moist. No oropharyngeal exudate.  Eyes: Conjunctivae and EOM are normal. Pupils are equal, round, and reactive to light. Right eye exhibits no discharge. Left eye exhibits no discharge. No scleral icterus.  Neck: Neck supple. No JVD present. No thyromegaly present.  Cardiovascular: Normal rate, normal heart sounds and intact distal pulses.   No murmur heard. Pulmonary/Chest: Effort normal and breath sounds normal. No respiratory distress. He has no wheezes. He has no rales. He exhibits no tenderness.  Abdominal: Soft. Bowel sounds are normal. He exhibits no mass. There is no tenderness. There is no rebound and no guarding.  Genitourinary:  Testicles normal. No hernias to direct palpation  Musculoskeletal: Normal range of motion. He exhibits no edema.  Lymphadenopathy:    He has no cervical adenopathy.  Neurological: He is alert and oriented to person, place, and time. He has normal reflexes. He displays normal reflexes. No cranial nerve deficit. Coordination normal.  Skin: Skin is warm and dry. No rash noted. He is not diaphoretic.  Tattoo on right arm. Chin is  pierced. Dark nevus upper anterior mid abdomen reportedly unchanged  Psychiatric: He has a normal mood and affect. His behavior is normal. Judgment and thought content normal.          Assessment & Plan:  Hypertension-stable on current regimen  Insomnia-successfully treated with generic Seroquel  Anxiety-depression treated with Lexapro   Low testosterone  Hypothyroidism-2 months ago restarted thyroid replacement therapy  History of smoking-not willing to quit  Plan: Return in 6 months for office visit and TSH as well as blood pressure check.

## 2014-03-22 ENCOUNTER — Other Ambulatory Visit: Payer: Self-pay | Admitting: Internal Medicine

## 2014-05-31 ENCOUNTER — Other Ambulatory Visit: Payer: Self-pay | Admitting: Internal Medicine

## 2014-06-21 ENCOUNTER — Ambulatory Visit (INDEPENDENT_AMBULATORY_CARE_PROVIDER_SITE_OTHER): Payer: BC Managed Care – PPO | Admitting: Emergency Medicine

## 2014-06-21 VITALS — BP 104/78 | HR 68 | Temp 98.6°F | Resp 16 | Ht 70.0 in | Wt 162.4 lb

## 2014-06-21 DIAGNOSIS — S61209A Unspecified open wound of unspecified finger without damage to nail, initial encounter: Secondary | ICD-10-CM

## 2014-06-21 DIAGNOSIS — S61219A Laceration without foreign body of unspecified finger without damage to nail, initial encounter: Secondary | ICD-10-CM

## 2014-06-21 NOTE — Progress Notes (Signed)
Procedure: Consent obtained.  MC block with digital block with 2% lidocaine and marcaine.  Avulsed skin piece repaired with 5-0 vicryl rapide. Drsg placed and wound care d/w pt.

## 2014-06-21 NOTE — Progress Notes (Signed)
Subjective:   This chart was scribed for Matthew Novak A. [name], MD by Forrestine Him, [facility] Scribe. This patient was seen in room 7 and the patient's care was started 1:16 PM.    Patient ID: Matthew Novak, male    DOB: [birth year], 38 y.o.   MRN: [id number]  Chief Complaint  Patient presents with  . Laceration    left hand, middle finger--laceration--cut with a knife just 1 hour ago    HPI  HPI Comments: NATHANAL Novak is a 38 y.o. male with a PMHx of HTN, hyperlipidemia, thyroid disease who presents to [facility] complaining of a laceration to the L 3rd finger sustained about 1 hour prior to arrival. Pt states he was prepping his dinner when he accidentally sliced his finger with a sharp knife. Pt arrives with the area wrapped with gauze and secured with a bandaid. At this time he denies any fever or chills. No numbness, loss of sensation, paresthesia, or weakness. Tetanus UTD. Pt with known allergies to contrast media. No other concerns this visit.   Patient Active Problem List   Diagnosis Date Noted  . Unspecified hypothyroidism 03/03/2014  . Insomnia 12/16/2013  . Low testosterone 03/09/2013  . Recovering alcoholic in remission 51/88/4166  . Alcohol withdrawal seizure 03/09/2013  . Anxiety 09/12/2011  . HTN (hypertension) 03/29/2011  . Depression 03/29/2011   Past Medical History  Diagnosis Date  . Hypertension   . Hyperlipidemia   . Thyroid disease     hypothyroidism  . Bipolar disorder    Past Surgical History  Procedure Laterality Date  . Mouth surgery     Allergies  Allergen Reactions  . Contrast Media [Iodinated Diagnostic Agents] Hives   Prior to Admission medications   Medication Sig Start Date End Date Taking? Authorizing Provider  escitalopram (LEXAPRO) 10 MG tablet take 3 tablets by mouth once daily   Yes Elby Showers, MD  levothyroxine (SYNTHROID, LEVOTHROID) 50 MCG tablet Take 1 tablet (50 mcg total) by mouth  daily. 12/19/13  Yes Elby Showers, MD  Multiple Vitamins-Minerals (MULTIVITAMIN WITH MINERALS) tablet Take 1 tablet by mouth daily.     Yes Historical Provider, MD  naproxen (NAPROSYN) 500 MG tablet take 1 tablet by mouth twice a day if needed 10/02/13  Yes Elby Showers, MD  QUEtiapine (SEROQUEL) 50 MG tablet Take 1 tablet (50 mg total) by mouth at bedtime. 01/17/14  Yes Elby Showers, MD  QUEtiapine (SEROQUEL) 50 MG tablet take 1 tablet by mouth once daily at bedtime   Yes Elby Showers, MD  simvastatin (ZOCOR) 20 MG tablet take 1 tablet by mouth once daily   Yes Elby Showers, MD  diazepam (VALIUM) 5 MG tablet take 1 tablet by mouth twice a day if needed 12/16/13   Elby Showers, MD    Review of Systems  Constitutional: Negative for fever and chills.  Skin: Positive for wound (laceration to L 3rd finger).  Neurological: Negative for weakness and numbness.    Triage Vitals: BP 104/78  Pulse 68  Temp(Src) 98.6 F (37 C) (Oral)  Resp 16  Ht 5\' 10"  (1.778 m)  Wt 162 lb 6.4 oz (73.664 kg)  BMI 23.30 kg/m2  SpO2 98%   Objective:  Physical Exam  Nursing note and vitals reviewed. Constitutional: He is oriented to person, place, and time. He appears well-developed and well-nourished.  HENT:  Head: Normocephalic.  Eyes: EOM are normal.  Neck: Normal range of motion.  Pulmonary/Chest: Effort normal.  Abdominal: He exhibits no distension.  Musculoskeletal: Normal range of motion.  Neurological: He is alert and oriented to person, place, and time.  Skin:  L middle finger with 1x1 cm loss of tissue over extensor surface middle phalanx No active bleeding  Psychiatric: He has a normal mood and affect.    Assessment & Plan:  Patient suffered a laceration with loss of skin and subcutaneous tissue over the extensor surface of the left middle finger. He brought the skin  with him. This was reattached. Patient understands the tissue may not be viable I personally performed the services  described in this documentation, which was scribed in my presence. The recorded information has been reviewed and is accurate.

## 2014-06-21 NOTE — Patient Instructions (Signed)
Recheck with me 8/25  WOUND CARE Please return in 11 days to have your stitches/staples removed or sooner if you have concerns. Marland Kitchen Keep area clean and dry for 24 hours. Do not remove bandage, if applied. . After 24 hours, remove bandage and wash wound gently with mild soap and warm water. Reapply a new bandage after cleaning wound, if directed. . Continue daily cleansing with soap and water until stitches/staples are removed. . Do not apply any ointments or creams to the wound while stitches/staples are in place, as this may cause delayed healing. . Notify the office if you experience any of the following signs of infection: Swelling, redness, pus drainage, streaking, fever >101.0 F . Notify the office if you experience excessive bleeding that does not stop after 15-20 minutes of constant, firm pressure.

## 2014-07-01 ENCOUNTER — Ambulatory Visit (INDEPENDENT_AMBULATORY_CARE_PROVIDER_SITE_OTHER): Payer: BC Managed Care – PPO | Admitting: Physician Assistant

## 2014-07-01 VITALS — BP 110/66 | HR 71 | Temp 98.1°F | Resp 18 | Ht 70.0 in | Wt 165.0 lb

## 2014-07-01 DIAGNOSIS — S61219D Laceration without foreign body of unspecified finger without damage to nail, subsequent encounter: Secondary | ICD-10-CM

## 2014-07-01 DIAGNOSIS — Z5189 Encounter for other specified aftercare: Secondary | ICD-10-CM

## 2014-07-01 DIAGNOSIS — S61209A Unspecified open wound of unspecified finger without damage to nail, initial encounter: Secondary | ICD-10-CM

## 2014-07-01 NOTE — Progress Notes (Signed)
   Subjective:    Patient ID: Matthew Novak, male    DOB: 03/19/1976, 38 y.o.   MRN: [id number]  HPI Pt presents to clinic for wound check.  He has had no problems with the area.  He has been keeping it covered.  Review of Systems     Objective:   Physical Exam  Vitals reviewed. Constitutional: He is oriented to person, place, and time. He appears well-developed and well-nourished.  Pulmonary/Chest: Effort normal.  Neurological: He is alert and oriented to person, place, and time.  Skin: Skin is warm and dry.  Wound on 3rd digit healing - avulsed skin has blood flow. Lateral aspect of wound edge is white  But no surrounding erythema.  Psychiatric: He has a normal mood and affect. His behavior is normal. Judgment and thought content normal.       Assessment & Plan:  Laceration of finger of left hand, subsequent encounter  Well healed wound. Due to vicryl rapide stitches being placed during wound repair we will keep in place and they should start to fall out within the next week.  If there are any concerns  He will RTC.  Windell Hummingbird PA-C  [facility] 07/01/2014 2:33 PM

## 2014-07-14 ENCOUNTER — Other Ambulatory Visit: Payer: Self-pay | Admitting: Internal Medicine

## 2014-08-16 ENCOUNTER — Other Ambulatory Visit: Payer: Self-pay | Admitting: Internal Medicine

## 2014-09-01 ENCOUNTER — Encounter: Payer: Self-pay | Admitting: Internal Medicine

## 2014-09-01 ENCOUNTER — Ambulatory Visit (INDEPENDENT_AMBULATORY_CARE_PROVIDER_SITE_OTHER): Payer: BLUE CROSS/BLUE SHIELD | Admitting: Internal Medicine

## 2014-09-01 VITALS — BP 118/78 | HR 65 | Temp 97.7°F | Ht 70.0 in | Wt 160.0 lb

## 2014-09-01 DIAGNOSIS — E038 Other specified hypothyroidism: Secondary | ICD-10-CM

## 2014-09-01 DIAGNOSIS — Z23 Encounter for immunization: Secondary | ICD-10-CM

## 2014-09-01 DIAGNOSIS — E785 Hyperlipidemia, unspecified: Secondary | ICD-10-CM | POA: Diagnosis not present

## 2014-09-01 LAB — TSH: TSH: 1.974 u[IU]/mL (ref 0.350–4.500)

## 2014-09-01 MED ORDER — ROSUVASTATIN CALCIUM 10 MG PO TABS: 10.0000 mg | ORAL_TABLET | Freq: Every day | ORAL | Status: DC

## 2014-09-01 NOTE — Patient Instructions (Signed)
Return in 3 months for OV lipid and liver panels

## 2014-09-01 NOTE — Progress Notes (Signed)
   Subjective:    Patient ID: Matthew Novak, male    DOB: 1976-10-09, 38 y.o.   MRN: [id number]  HPI For recheck on: Hypertension, depression, hypothyroidism, hyperlipidemia. He thinks Zocor is causing some muscle pain and perhaps some erectile dysfunction. I'm not aware that Zocor typically causes erectile dysfunction. It may be his antidepressant. He stopped his Zocor for a while and thought erections improved. Wants to try different statin medication. Gave him samples of Crestor and prescription for same. Need to recheck in 3 months. TSH drawn today. Flu vaccine drawn today. No other complaints or issues.    Review of Systems     Objective:   Physical Exam Chest is clear to auscultation. Cardiac exam: regular rate and rhythm normal S1 and S2. Extremities without edema.       Assessment & Plan:  Hypertension  Hypothyroidism  Anxiety depression  Hyperlipidemia  Plan: Change to Crestor 10 mg daily. Discontinue Zocor. TSH drawn and pending. Return in early February for fasting lipid panel liver functions and office visit.

## 2014-09-18 ENCOUNTER — Other Ambulatory Visit: Payer: Self-pay | Admitting: Internal Medicine

## 2014-11-16 ENCOUNTER — Other Ambulatory Visit: Payer: Self-pay | Admitting: Internal Medicine

## 2014-12-08 ENCOUNTER — Ambulatory Visit (INDEPENDENT_AMBULATORY_CARE_PROVIDER_SITE_OTHER): Payer: BLUE CROSS/BLUE SHIELD | Admitting: Internal Medicine

## 2014-12-08 ENCOUNTER — Encounter: Payer: Self-pay | Admitting: Internal Medicine

## 2014-12-08 VITALS — BP 112/70 | HR 65 | Temp 97.9°F | Wt 171.0 lb

## 2014-12-08 DIAGNOSIS — M19049 Primary osteoarthritis, unspecified hand: Secondary | ICD-10-CM

## 2014-12-08 DIAGNOSIS — M79641 Pain in right hand: Secondary | ICD-10-CM | POA: Insufficient documentation

## 2014-12-08 DIAGNOSIS — E785 Hyperlipidemia, unspecified: Secondary | ICD-10-CM

## 2014-12-08 DIAGNOSIS — M129 Arthropathy, unspecified: Secondary | ICD-10-CM

## 2014-12-08 DIAGNOSIS — M79642 Pain in left hand: Secondary | ICD-10-CM

## 2014-12-08 LAB — HEPATIC FUNCTION PANEL
ALK PHOS: 61 U/L (ref 39–117)
ALT: 19 U/L (ref 0–53)
AST: 29 U/L (ref 0–37)
Albumin: 4.7 g/dL (ref 3.5–5.2)
BILIRUBIN INDIRECT: 0.4 mg/dL (ref 0.2–1.2)
Bilirubin, Direct: 0.1 mg/dL (ref 0.0–0.3)
Total Bilirubin: 0.5 mg/dL (ref 0.2–1.2)
Total Protein: 6.7 g/dL (ref 6.0–8.3)

## 2014-12-08 LAB — LIPID PANEL
CHOLESTEROL: 153 mg/dL (ref 0–200)
HDL: 47 mg/dL (ref >39)
LDL CALC: 86 mg/dL (ref 0–99)
Total CHOL/HDL Ratio: 3.3 Ratio
Triglycerides: 102 mg/dL (ref <150)
VLDL: 20 mg/dL (ref 0–40)

## 2014-12-08 MED ORDER — MELOXICAM 15 MG PO TABS: 15.0000 mg | ORAL_TABLET | Freq: Every day | ORAL | Status: DC

## 2014-12-08 NOTE — Patient Instructions (Signed)
Take meloxicam 15 mg daily. Wear bilateral wrist splints at night. Lipid panel liver functions pending on Crestor. Physical exam booked for June 2016. Discontinue Naprosyn.

## 2014-12-08 NOTE — Progress Notes (Signed)
   Subjective:    Patient ID: Matthew Novak, male    DOB: 06-Apr-1976, 39 y.o.   MRN: [id number]  HPI  For follow up hyperlipidemia. At last visit he was changed from Zocor to Crestor because he thought Zocor was causing some erectile dysfunction. Says that has improved. He's here for follow-up of lipid panel liver functions on Crestor. Has developed some bilateral hand pain. Thinks he has arthritis. He does work as a Aeronautical engineer at [employer]. Says joints in hands are quite sore. He has been taking Naprosyn. Also noticed some numbness in his fingertips as well. Other medical issues are stable.    Review of Systems     Objective:   Physical Exam  He has bilateral Heberden's and Bouchard's nodes. These are tender to touch but not red or hot. He has positive tingling and numbness when his wrists are dorsiflexed.      Assessment & Plan:  Bilateral carpal tunnel syndrome  Hypothyroidism-TSH checked at last visit but not today  Hyperlipidemia-now on Crestor so we are following up with fasting lipid panel liver functions today  Bilateral hand pain-likely osteoarthritis. Check CCP for rheumatoid arthritis.  Plan: Meloxicam 15 mg daily. Discontinue Naprosyn. Prescription for bilateral wrist plans to wear at night. Book physical examination for June 2016.

## 2014-12-08 NOTE — Addendum Note (Signed)
Addended by: Leota Jacobsen on: 12/08/2014 10:26 AM   Modules accepted: Orders

## 2014-12-10 LAB — CYCLIC CITRUL PEPTIDE ANTIBODY, IGG: Cyclic Citrullin Peptide Ab: <2 U/mL (ref 0.0–5.0)

## 2015-01-14 ENCOUNTER — Other Ambulatory Visit: Payer: Self-pay | Admitting: Internal Medicine

## 2015-02-20 ENCOUNTER — Other Ambulatory Visit: Payer: Self-pay | Admitting: Internal Medicine

## 2015-03-23 ENCOUNTER — Other Ambulatory Visit: Payer: Self-pay | Admitting: Internal Medicine

## 2015-04-13 ENCOUNTER — Ambulatory Visit (INDEPENDENT_AMBULATORY_CARE_PROVIDER_SITE_OTHER): Payer: BLUE CROSS/BLUE SHIELD | Admitting: Internal Medicine

## 2015-04-13 ENCOUNTER — Encounter: Payer: Self-pay | Admitting: Internal Medicine

## 2015-04-13 VITALS — BP 112/82 | HR 74 | Temp 98.1°F | Ht 70.0 in | Wt 168.0 lb

## 2015-04-13 DIAGNOSIS — Z0000 Encounter for general adult medical examination without abnormal findings: Secondary | ICD-10-CM

## 2015-04-13 DIAGNOSIS — E039 Hypothyroidism, unspecified: Secondary | ICD-10-CM

## 2015-04-13 DIAGNOSIS — F419 Anxiety disorder, unspecified: Secondary | ICD-10-CM

## 2015-04-13 DIAGNOSIS — R202 Paresthesia of skin: Secondary | ICD-10-CM

## 2015-04-13 DIAGNOSIS — M542 Cervicalgia: Secondary | ICD-10-CM | POA: Diagnosis not present

## 2015-04-13 DIAGNOSIS — E785 Hyperlipidemia, unspecified: Secondary | ICD-10-CM | POA: Diagnosis not present

## 2015-04-13 LAB — CBC WITH DIFFERENTIAL/PLATELET
Basophils Absolute: 0.1 10*3/uL (ref 0.0–0.1)
Basophils Relative: 1 % (ref 0–1)
Eosinophils Absolute: 0.5 10*3/uL (ref 0.0–0.7)
Eosinophils Relative: 7 % — ABNORMAL HIGH (ref 0–5)
HEMATOCRIT: 47.1 % (ref 39.0–52.0)
Hemoglobin: 16 g/dL (ref 13.0–17.0)
LYMPHS PCT: 34 % (ref 12–46)
Lymphs Abs: 2.4 10*3/uL (ref 0.7–4.0)
MCH: 29.9 pg (ref 26.0–34.0)
MCHC: 34 g/dL (ref 30.0–36.0)
MCV: 88 fL (ref 78.0–100.0)
MONOS PCT: 10 % (ref 3–12)
MPV: 9 fL (ref 8.6–12.4)
Monocytes Absolute: 0.7 10*3/uL (ref 0.1–1.0)
NEUTROS PCT: 48 % (ref 43–77)
Neutro Abs: 3.5 10*3/uL (ref 1.7–7.7)
Platelets: 241 10*3/uL (ref 150–400)
RBC: 5.35 MIL/uL (ref 4.22–5.81)
RDW: 14.1 % (ref 11.5–15.5)
WBC: 7.2 10*3/uL (ref 4.0–10.5)

## 2015-04-13 LAB — POCT URINALYSIS DIPSTICK
Bilirubin, UA: NEGATIVE
Blood, UA: NEGATIVE
Glucose, UA: NEGATIVE
Ketones, UA: NEGATIVE
Leukocytes, UA: NEGATIVE
Nitrite, UA: NEGATIVE
Protein, UA: NEGATIVE
Spec Grav, UA: 1.02
Urobilinogen, UA: NEGATIVE
pH, UA: 6

## 2015-04-13 LAB — COMPLETE METABOLIC PANEL WITHOUT GFR
ALT: 13 U/L (ref 0–53)
AST: 25 U/L (ref 0–37)
Albumin: 4.4 g/dL (ref 3.5–5.2)
Alkaline Phosphatase: 54 U/L (ref 39–117)
BUN: 16 mg/dL (ref 6–23)
CO2: 29 meq/L (ref 19–32)
Calcium: 9.6 mg/dL (ref 8.4–10.5)
Chloride: 105 meq/L (ref 96–112)
Creat: 0.92 mg/dL (ref 0.50–1.35)
GFR, Est African American: >89 mL/min
GFR, Est Non African American: >89 mL/min
Glucose, Bld: 95 mg/dL (ref 70–99)
Potassium: 4.3 meq/L (ref 3.5–5.3)
Sodium: 141 meq/L (ref 135–145)
Total Bilirubin: 0.5 mg/dL (ref 0.2–1.2)
Total Protein: 6.8 g/dL (ref 6.0–8.3)

## 2015-04-13 LAB — LIPID PANEL
CHOL/HDL RATIO: 5.1 ratio
CHOLESTEROL: 216 mg/dL — AB (ref 0–200)
HDL: 42 mg/dL (ref >=40)
LDL Cholesterol: 153 mg/dL — ABNORMAL HIGH (ref 0–99)
TRIGLYCERIDES: 104 mg/dL (ref <150)
VLDL: 21 mg/dL (ref 0–40)

## 2015-04-13 LAB — VITAMIN B12: Vitamin B-12: 774 pg/mL (ref 211–911)

## 2015-04-13 LAB — TSH: TSH: 1.039 u[IU]/mL (ref 0.350–4.500)

## 2015-04-13 LAB — FOLATE: Folate: >20 ng/mL

## 2015-04-13 NOTE — Patient Instructions (Addendum)
Try to get MRI of neck to see if he has cervical disc disease causing paresthesias and neck pain. Check B 12 level. Continue same medications. Review lab work. Currently not taking Crestor.

## 2015-04-13 NOTE — Progress Notes (Signed)
Subjective:    Patient ID: Matthew Novak, male    DOB: 30-Jun-1976, 39 y.o.   MRN: [id number]  HPI Stopped Crestor about a month ago because he thought he felt better off it. In general feels better. Not as edgy.  Also, has neck pain with burning in posterior neck and shoulder. No radiculopathy in arms but feels weaker in arms.. History of heavy lifting. No recent injury. Is worried about the pain.  He is now 39 years old. Here for health maintenance exam and evaluation of medical issues. History of hypothyroidism, history of alcohol and drug abuse but has been in recovery for 4 years he says. Had 1 alcohol withdrawal seizure in the remote past. His neurological exam and workup were negative at that time.  For anxiety, he takes Lexapro and Seroquel. He does well with that regimen.  History of hypertension for which he used to take  Altace. Not taking antihypertensives at present time and blood pressure is stable   History of hyperlipidemia formerly on Crestor which he recently stopped.  History of tendinitis in right shoulder arthropathy but currently this is not an issue.  Social history: He is married and has one son. 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 He works as a Aeronautical engineer. Smokes about a half pack cigarettes daily. Is smoked for over 20 years.  Is allergic to x-ray contrast dye.  Family history: Father died at age 39 of ALS with history of MI and hypertension. Mother living with history of hypertension. Brother living with history of hypertension.    Review of Systems     Objective:   Physical Exam  Constitutional: He is oriented to person, place, and time. He appears well-developed and well-nourished. No distress.  HENT:  Head: Normocephalic and atraumatic.  Right Ear: External ear normal.  Left Ear: External ear normal.  Mouth/Throat: Oropharynx is clear and moist. No oropharyngeal exudate.  Eyes: Conjunctivae and EOM are normal. Pupils are equal, round, and  reactive to light. Right eye exhibits no discharge. Left eye exhibits no discharge. No scleral icterus.  Neck: Neck supple. No JVD present. No thyromegaly present.  Cardiovascular: Normal rate, regular rhythm, normal heart sounds and intact distal pulses.   No murmur heard. Pulmonary/Chest: Effort normal and breath sounds normal. No respiratory distress. He has no wheezes. He has no rales.  Abdominal: Soft. Bowel sounds are normal. He exhibits no distension and no mass. There is no tenderness. There is no rebound and no guarding.  Genitourinary:  No hernias to direct palpation  Musculoskeletal: He exhibits no edema.  Lymphadenopathy:    He has no cervical adenopathy.  Neurological: He is alert and oriented to person, place, and time. He has normal reflexes. No cranial nerve deficit. Coordination normal.  Skin: Skin is warm and dry. No rash noted. He is not diaphoretic.  Psychiatric: He has a normal mood and affect. His behavior is normal. Judgment and thought content normal.  Vitals reviewed.         Assessment & Plan:  Normal health maintenance exam  Hyperlipidemia-currently off Crestor. Thinks he feels better off Crestor.  Hypothyroidism-is on thyroid replacement therapy  Paresthesias neck along with neck pain-try to order MRI of the C-spine to see if he has cervical disc disease causing pain. Check B 12 level.  History of anxiety-treated with Lexapro and Seroquel  Recovering alcoholic-has been in recovery for 4 years  Plan: Depending on lab work return in 6 months or as needed. Further  recommendations to follow once lab work is reviewed particularly lipid panel.

## 2015-04-14 ENCOUNTER — Telehealth: Payer: Self-pay | Admitting: *Deleted

## 2015-04-14 LAB — SEDIMENTATION RATE: Sed Rate: 1 mm/hr (ref 0–15)

## 2015-04-14 LAB — ANA: Anti Nuclear Antibody(ANA): NEGATIVE

## 2015-04-14 NOTE — Telephone Encounter (Signed)
Left message for patient to call back  

## 2015-04-15 ENCOUNTER — Telehealth: Payer: Self-pay | Admitting: *Deleted

## 2015-04-15 DIAGNOSIS — M542 Cervicalgia: Secondary | ICD-10-CM

## 2015-04-15 DIAGNOSIS — G542 Cervical root disorders, not elsewhere classified: Secondary | ICD-10-CM

## 2015-04-15 NOTE — Telephone Encounter (Signed)
Reviewed lab work with patient 

## 2015-04-15 NOTE — Telephone Encounter (Signed)
Ordered MRI neck prior Josem Kaufmann #[id number] good 04/15/15-05/14/15 left message on patient voice mail for him to call [facility] to schedule appt

## 2015-04-23 ENCOUNTER — Telehealth: Payer: Self-pay | Admitting: *Deleted

## 2015-04-23 DIAGNOSIS — M542 Cervicalgia: Secondary | ICD-10-CM

## 2015-04-23 DIAGNOSIS — G542 Cervical root disorders, not elsewhere classified: Secondary | ICD-10-CM

## 2015-04-23 NOTE — Telephone Encounter (Signed)
[facility] called stated order needed to be changed from MRI neck w/wo contrast to MRI cervical spine without contrast order changed

## 2015-04-25 ENCOUNTER — Ambulatory Visit
Admission: RE | Admit: 2015-04-25 | Discharge: 2015-04-25 | Disposition: A | Discharge status: Home / Self Care | Payer: BLUE CROSS/BLUE SHIELD | Source: Ambulatory Visit | Attending: Internal Medicine | Admitting: Internal Medicine

## 2015-04-25 DIAGNOSIS — G542 Cervical root disorders, not elsewhere classified: Secondary | ICD-10-CM

## 2015-04-25 DIAGNOSIS — M542 Cervicalgia: Secondary | ICD-10-CM

## 2015-04-29 ENCOUNTER — Telehealth: Payer: Self-pay | Admitting: *Deleted

## 2015-04-29 DIAGNOSIS — G542 Cervical root disorders, not elsewhere classified: Secondary | ICD-10-CM

## 2015-04-29 DIAGNOSIS — M542 Cervicalgia: Secondary | ICD-10-CM

## 2015-04-29 NOTE — Telephone Encounter (Signed)
Faxed information to [facility] office they will contact patient to set up appt. Patient aware they will contact him. Results of MRI reviewed with patient.

## 2015-05-08 ENCOUNTER — Other Ambulatory Visit: Payer: Self-pay | Admitting: Internal Medicine

## 2015-05-18 ENCOUNTER — Encounter: Payer: Self-pay | Admitting: Internal Medicine

## 2015-05-18 ENCOUNTER — Ambulatory Visit (INDEPENDENT_AMBULATORY_CARE_PROVIDER_SITE_OTHER): Payer: BLUE CROSS/BLUE SHIELD | Admitting: Internal Medicine

## 2015-05-18 VITALS — BP 104/62 | HR 74 | Temp 98.1°F | Wt 167.0 lb

## 2015-05-18 DIAGNOSIS — I959 Hypotension, unspecified: Secondary | ICD-10-CM

## 2015-05-18 LAB — COMPREHENSIVE METABOLIC PANEL
ALBUMIN: 4.5 g/dL (ref 3.5–5.2)
ALK PHOS: 50 U/L (ref 39–117)
ALT: 9 U/L (ref 0–53)
AST: 20 U/L (ref 0–37)
BILIRUBIN TOTAL: 0.4 mg/dL (ref 0.2–1.2)
BUN: 13 mg/dL (ref 6–23)
CALCIUM: 9.4 mg/dL (ref 8.4–10.5)
CHLORIDE: 104 meq/L (ref 96–112)
CO2: 28 meq/L (ref 19–32)
CREATININE: 0.8 mg/dL (ref 0.50–1.35)
Glucose, Bld: 115 mg/dL — ABNORMAL HIGH (ref 70–99)
Potassium: 3.9 mEq/L (ref 3.5–5.3)
Sodium: 143 mEq/L (ref 135–145)
TOTAL PROTEIN: 6.4 g/dL (ref 6.0–8.3)

## 2015-05-18 LAB — POCT URINALYSIS DIPSTICK
Bilirubin, UA: NEGATIVE
Glucose, UA: NEGATIVE
KETONES UA: NEGATIVE
Leukocytes, UA: NEGATIVE
NITRITE UA: NEGATIVE
PH UA: 6
Protein, UA: NEGATIVE
RBC UA: NEGATIVE
Spec Grav, UA: <=1.005
Urobilinogen, UA: NEGATIVE

## 2015-05-19 ENCOUNTER — Telehealth: Payer: Self-pay | Admitting: *Deleted

## 2015-05-19 LAB — CBC WITH DIFFERENTIAL/PLATELET
BASOS ABS: 0.1 10*3/uL (ref 0.0–0.1)
Basophils Relative: 1 % (ref 0–1)
Eosinophils Absolute: 0.2 10*3/uL (ref 0.0–0.7)
Eosinophils Relative: 2 % (ref 0–5)
HCT: 42.4 % (ref 39.0–52.0)
Hemoglobin: 14.3 g/dL (ref 13.0–17.0)
LYMPHS PCT: 32 % (ref 12–46)
Lymphs Abs: 2.8 10*3/uL (ref 0.7–4.0)
MCH: 29.7 pg (ref 26.0–34.0)
MCHC: 33.7 g/dL (ref 30.0–36.0)
MCV: 88.1 fL (ref 78.0–100.0)
MONOS PCT: 8 % (ref 3–12)
MPV: 9.3 fL (ref 8.6–12.4)
Monocytes Absolute: 0.7 10*3/uL (ref 0.1–1.0)
NEUTROS PCT: 57 % (ref 43–77)
Neutro Abs: 5 10*3/uL (ref 1.7–7.7)
Platelets: 236 10*3/uL (ref 150–400)
RBC: 4.81 MIL/uL (ref 4.22–5.81)
RDW: 13.9 % (ref 11.5–15.5)
WBC: 8.8 10*3/uL (ref 4.0–10.5)

## 2015-05-19 NOTE — Telephone Encounter (Signed)
Reviewed lab work with patient 

## 2015-05-21 ENCOUNTER — Other Ambulatory Visit: Payer: Self-pay | Admitting: Internal Medicine

## 2015-06-06 ENCOUNTER — Encounter: Payer: Self-pay | Admitting: Internal Medicine

## 2015-06-06 NOTE — Patient Instructions (Signed)
Restart Lexapro immediately. Stay well hydrated. Call if symptoms recur.

## 2015-06-06 NOTE — Progress Notes (Signed)
   Subjective:    Patient ID: Matthew Novak, male    DOB: 02-16-1976, 39 y.o.   MRN: [id number]  HPI Patient here today for evaluation regarding issue with near syncope recently. He has a history of anxiety depression and is on Lexapro and Seroquel. However, he apparently ran out of Lexapro for some 4 or 5 days. Had no nausea or vomiting. Says he did not realize he could have discontinuation syndrome from stopping Lexapro abruptly. Apparently had discontinued Lexapro abruptly for some 3 days in the past but not for this many days. Denies alcohol consumption during that time. Denies significant stress to have caused any issue. History of hypothyroidism. Does have remote history of alcohol withdrawal seizure.   Review of Systems     Objective:   Physical Exam Blood pressure lying is 102/68 with pulse of 71. Blood pressure sitting 102/66 with pulse of 73 blood pressure standing 104/62 with pulse of 74. Pulse oximetry 97%. He is afebrile  Skin warm and dry. Nodes none. Extraocular movements are full. PERRLA. TMs and pharynx are clear. Neck is supple without adenopathy or thyromegaly. No JVD. Chest clear to auscultation. Cardiac exam regular rate and rhythm normal S1 and S2 without murmurs or gallops. Neuro: Cranial nerves II through XII grossly intact. Cerebellar finger to nose testing normal. Gait is normal. Alert and oriented 3. Muscle strength is normal in the upper and lower extremity. Deep tendon reflexes 2+ and symmetrical. Sensation intact.     Assessment & Plan:  Near syncopal episode related to stopping SSRI abruptly  Plan: Patient assured. Patient is to stay well-hydrated during hot weather. He is to restart Lexapro immediately. Call if symptoms persist. CBC and nonfasting C met drawn. Urinalysis is normal.

## 2015-07-01 ENCOUNTER — Other Ambulatory Visit: Payer: Self-pay | Admitting: Internal Medicine

## 2015-09-16 ENCOUNTER — Other Ambulatory Visit: Payer: Self-pay | Admitting: Internal Medicine

## 2015-10-11 ENCOUNTER — Other Ambulatory Visit: Payer: Self-pay | Admitting: Internal Medicine

## 2015-10-12 ENCOUNTER — Encounter: Payer: Self-pay | Admitting: Internal Medicine

## 2015-10-12 ENCOUNTER — Ambulatory Visit (INDEPENDENT_AMBULATORY_CARE_PROVIDER_SITE_OTHER): Payer: BLUE CROSS/BLUE SHIELD | Admitting: Internal Medicine

## 2015-10-12 VITALS — BP 118/78 | HR 82 | Temp 97.1°F | Resp 20 | Ht 70.0 in

## 2015-10-12 DIAGNOSIS — Z23 Encounter for immunization: Secondary | ICD-10-CM | POA: Diagnosis not present

## 2015-10-12 DIAGNOSIS — E785 Hyperlipidemia, unspecified: Secondary | ICD-10-CM

## 2015-10-12 DIAGNOSIS — F411 Generalized anxiety disorder: Secondary | ICD-10-CM

## 2015-10-12 DIAGNOSIS — E039 Hypothyroidism, unspecified: Secondary | ICD-10-CM | POA: Diagnosis not present

## 2015-10-12 LAB — LIPID PANEL
CHOLESTEROL: 224 mg/dL — AB (ref 125–200)
HDL: 31 mg/dL — AB (ref >=40)
LDL CALC: 150 mg/dL — AB (ref <130)
TRIGLYCERIDES: 214 mg/dL — AB (ref <150)
Total CHOL/HDL Ratio: 7.2 Ratio — ABNORMAL HIGH (ref <=5.0)
VLDL: 43 mg/dL — AB (ref <30)

## 2015-10-12 LAB — TSH: TSH: 2.661 u[IU]/mL (ref 0.350–4.500)

## 2015-10-12 MED ORDER — ESCITALOPRAM OXALATE 20 MG PO TABS: 20.0000 mg | ORAL_TABLET | Freq: Every day | ORAL | Status: DC

## 2015-10-12 NOTE — Patient Instructions (Signed)
Increase Lexapro to 20 mg daily. Return in 6 months for physical exam. Fasting lipid panel and TSH drawn.

## 2015-10-12 NOTE — Progress Notes (Signed)
   Subjective:    Patient ID: Matthew Novak, male    DOB: 01/11/1976, 39 y.o.   MRN: [id number]  HPI Dr. Vertell Limber did surgery in October. He had herniated cervical disc with cervical spondylosis and radiculopathy C5-C6. He underwent anterior cervical decompression and fusion at C5-C6 with peek interbody cage morcellized bone autograft and anterior cervical plate. Right ulnar nerve decompression and right carpal tunnel release   Going back to work today. He works as a Aeronautical engineer. No new complaints. Feels that anxiety is a bit worse. Seems stenosis it when he goes to [place] alone. If wife is with him he can distract himself and not be so worried about the people around him. Looks for exits and tries to think of an escape route. Has an element of panic disorder. We'll increase Lexapro to 20 mg daily and see if that helps.  Fasting TSH and lipid panel drawn today. He is not on Crestor at present time.    Review of Systems see above. Anxiety-fair     Objective:   Physical Exam  Skin warm and dry. Nodes none. Neck supple without JVD thyromegaly or carotid bruits. Chest clear to auscultation. Cardiac exam regular rate and rhythm without murmurs      Assessment & Plan:  Anxiety  History of alcohol abuse  Hypothyroidism-TSH checked today  Recent cervical disc surgery, right ulnar nerve decompression and right carpal tunnel release  Hyperlipidemia-currently not on statin medication  Plan: Increase Lexapro to 20 mg daily. See if this helps anxiety. Return in 6 months for physical exam. Fasting lipid panel and TSH drawn today. Flu vaccine given.

## 2015-11-03 ENCOUNTER — Telehealth: Payer: Self-pay | Admitting: Internal Medicine

## 2015-11-03 MED ORDER — ESCITALOPRAM OXALATE 20 MG PO TABS: ORAL_TABLET | ORAL | Status: DC

## 2015-11-03 NOTE — Telephone Encounter (Signed)
Patient notified via vcm.  

## 2015-11-03 NOTE — Telephone Encounter (Signed)
Lexapro 30 mg daily.  He was taking 3 10mg  daily.  He ran out early since we changed his dosage in the middle of his prescription.    He's now taking 40mg  daily.  So, he needs a new Rx called in since we changed his prescription.  He has to take 1 bid.      Pharmacy:  [facility] on [address]

## 2015-11-03 NOTE — Telephone Encounter (Signed)
Refill x 6 months 

## 2015-11-23 ENCOUNTER — Other Ambulatory Visit: Payer: Self-pay | Admitting: Internal Medicine

## 2015-12-14 ENCOUNTER — Other Ambulatory Visit: Payer: Self-pay

## 2015-12-14 MED ORDER — LEVOTHYROXINE SODIUM 50 MCG PO TABS: 50.0000 ug | ORAL_TABLET | Freq: Every day | ORAL | Status: DC

## 2016-02-02 ENCOUNTER — Telehealth: Payer: Self-pay | Admitting: Internal Medicine

## 2016-02-02 MED ORDER — MELOXICAM 15 MG PO TABS: 15.0000 mg | ORAL_TABLET | Freq: Every day | ORAL | Status: DC

## 2016-02-02 NOTE — Telephone Encounter (Signed)
Refill through June has PE appt then.

## 2016-02-02 NOTE — Telephone Encounter (Signed)
Patient would like a refill on his Meloxicam prescription.

## 2016-02-08 DIAGNOSIS — M542 Cervicalgia: Secondary | ICD-10-CM | POA: Diagnosis not present

## 2016-02-11 DIAGNOSIS — M542 Cervicalgia: Secondary | ICD-10-CM | POA: Diagnosis not present

## 2016-03-07 DIAGNOSIS — M5412 Radiculopathy, cervical region: Secondary | ICD-10-CM | POA: Diagnosis not present

## 2016-03-07 DIAGNOSIS — M5020 Other cervical disc displacement, unspecified cervical region: Secondary | ICD-10-CM | POA: Diagnosis not present

## 2016-03-07 DIAGNOSIS — M542 Cervicalgia: Secondary | ICD-10-CM | POA: Diagnosis not present

## 2016-04-14 ENCOUNTER — Other Ambulatory Visit: Payer: BLUE CROSS/BLUE SHIELD | Admitting: Internal Medicine

## 2016-04-14 ENCOUNTER — Other Ambulatory Visit: Payer: Self-pay | Admitting: Internal Medicine

## 2016-04-14 DIAGNOSIS — Z79899 Other long term (current) drug therapy: Secondary | ICD-10-CM | POA: Diagnosis not present

## 2016-04-14 DIAGNOSIS — Z125 Encounter for screening for malignant neoplasm of prostate: Secondary | ICD-10-CM

## 2016-04-14 DIAGNOSIS — Z0000 Encounter for general adult medical examination without abnormal findings: Secondary | ICD-10-CM | POA: Diagnosis not present

## 2016-04-14 DIAGNOSIS — E039 Hypothyroidism, unspecified: Secondary | ICD-10-CM

## 2016-04-14 DIAGNOSIS — E785 Hyperlipidemia, unspecified: Secondary | ICD-10-CM | POA: Diagnosis not present

## 2016-04-14 LAB — LIPID PANEL
Cholesterol: 200 mg/dL (ref 125–200)
HDL: 48 mg/dL (ref >=40)
LDL CALC: 136 mg/dL — AB (ref <130)
TRIGLYCERIDES: 82 mg/dL (ref <150)
Total CHOL/HDL Ratio: 4.2 Ratio (ref <=5.0)
VLDL: 16 mg/dL (ref <30)

## 2016-04-14 LAB — CBC WITH DIFFERENTIAL/PLATELET
Basophils Absolute: 62 cells/uL (ref 0–200)
Basophils Relative: 1 %
Eosinophils Absolute: 620 cells/uL — ABNORMAL HIGH (ref 15–500)
Eosinophils Relative: 10 %
HEMATOCRIT: 45.1 % (ref 38.5–50.0)
Hemoglobin: 15.1 g/dL (ref 13.2–17.1)
LYMPHS PCT: 36 %
Lymphs Abs: 2232 cells/uL (ref 850–3900)
MCH: 29.4 pg (ref 27.0–33.0)
MCHC: 33.5 g/dL (ref 32.0–36.0)
MCV: 87.7 fL (ref 80.0–100.0)
MONO ABS: 620 {cells}/uL (ref 200–950)
MONOS PCT: 10 %
MPV: 9.3 fL (ref 7.5–12.5)
NEUTROS PCT: 43 %
Neutro Abs: 2666 cells/uL (ref 1500–7800)
PLATELETS: 229 10*3/uL (ref 140–400)
RBC: 5.14 MIL/uL (ref 4.20–5.80)
RDW: 14.3 % (ref 11.0–15.0)
WBC: 6.2 10*3/uL (ref 3.8–10.8)

## 2016-04-14 LAB — TSH: TSH: 1.41 mIU/L (ref 0.40–4.50)

## 2016-04-15 ENCOUNTER — Encounter: Payer: Self-pay | Admitting: Internal Medicine

## 2016-04-15 ENCOUNTER — Ambulatory Visit (INDEPENDENT_AMBULATORY_CARE_PROVIDER_SITE_OTHER): Payer: BLUE CROSS/BLUE SHIELD | Admitting: Internal Medicine

## 2016-04-15 VITALS — BP 118/60 | HR 96 | Temp 98.2°F | Resp 18 | Ht 70.0 in | Wt 169.0 lb

## 2016-04-15 DIAGNOSIS — E039 Hypothyroidism, unspecified: Secondary | ICD-10-CM | POA: Diagnosis not present

## 2016-04-15 DIAGNOSIS — F411 Generalized anxiety disorder: Secondary | ICD-10-CM

## 2016-04-15 DIAGNOSIS — M5489 Other dorsalgia: Secondary | ICD-10-CM | POA: Diagnosis not present

## 2016-04-15 DIAGNOSIS — M549 Dorsalgia, unspecified: Secondary | ICD-10-CM | POA: Diagnosis not present

## 2016-04-15 DIAGNOSIS — Z0000 Encounter for general adult medical examination without abnormal findings: Secondary | ICD-10-CM | POA: Diagnosis not present

## 2016-04-15 DIAGNOSIS — Z8739 Personal history of other diseases of the musculoskeletal system and connective tissue: Secondary | ICD-10-CM

## 2016-04-15 DIAGNOSIS — M2550 Pain in unspecified joint: Secondary | ICD-10-CM

## 2016-04-15 LAB — COMPLETE METABOLIC PANEL WITH GFR
ALBUMIN: 4.4 g/dL (ref 3.6–5.1)
ALK PHOS: 65 U/L (ref 40–115)
ALT: 10 U/L (ref 9–46)
AST: 18 U/L (ref 10–40)
BUN: 17 mg/dL (ref 7–25)
CALCIUM: 9 mg/dL (ref 8.6–10.3)
CO2: 27 mmol/L (ref 20–31)
Chloride: 109 mmol/L (ref 98–110)
Creat: 0.81 mg/dL (ref 0.60–1.35)
Glucose, Bld: 97 mg/dL (ref 65–99)
POTASSIUM: 5.1 mmol/L (ref 3.5–5.3)
Sodium: 144 mmol/L (ref 135–146)
Total Bilirubin: 0.4 mg/dL (ref 0.2–1.2)
Total Protein: 6.5 g/dL (ref 6.1–8.1)

## 2016-04-15 LAB — POCT URINALYSIS DIPSTICK
Bilirubin, UA: NEGATIVE
Blood, UA: NEGATIVE
Glucose, UA: NEGATIVE
Ketones, UA: NEGATIVE
Leukocytes, UA: NEGATIVE
Nitrite, UA: NEGATIVE
PH UA: 6
PROTEIN UA: NEGATIVE
Spec Grav, UA: >=1.03
UROBILINOGEN UA: 0.2

## 2016-04-15 LAB — PSA: PSA: 0.52 ng/mL (ref <=4.00)

## 2016-04-15 MED ORDER — MELOXICAM 15 MG PO TABS: 15.0000 mg | ORAL_TABLET | Freq: Every day | ORAL | Status: DC

## 2016-04-15 NOTE — Progress Notes (Signed)
Subjective:    Patient ID: Matthew Novak, male    DOB: 06/15/1976, 40 y.o.   MRN: [id number]  HPI 40 year old Male in today for health maintenance exam and evaluation of medical problems. Says his joints have been aching recently. 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 He has been taken Mobitz 15 mg daily. Says joints hurt worse if he does not take the Mobitz. Also has low back pain. Suspect this is musculoskeletal pain but would want to rule out inflammatory arthritis so number of lab studies were added today including sedimentation rate, ANA, CCP and rheumatoid factor.  He has a history of hypothyroidism, hyperlipidemia, paresthesias, anxiety disorder.  At last physical exam June 2016 he was complaining of neck and shoulder pain.  For anxiety he takes Lexapro and Seroquel and does well with that regimen.  History of hypertension but currently not taking antihypertensive medication and blood pressures have been excellent. Blood pressure was elevated when he was consuming alcohol. He has a history of alcohol and drug abuse but has been in recovery for 5 years he says. One alcohol withdrawal seizure in the remote past. At that time his neurological exam and workup were negative by a neurologist.  History of hyperlipidemia but currently does not take lipid-lowering medication. Used to take Crestor. He feels better off Crestor.  History of tendinitis and right shoulder.  Social history: He is married. Has one son. He and his wife operate [place]. He works as a Aeronautical engineer. Smokes about a half pack of cigarettes daily. Has smoked for over 20 years.  Is allergic to x-ray contrast dye.  In 2016 when he was complaining of neck pain he had an MRI of the C-spine that showed right foraminal encroachment at C5-C6  secondary to disc and osteophyte formation  Family history: Father died at age 6 of a less with history of MI and hypertension. Mother  living with history of hypertension. Brother living with history of hypertension.    Review of Systems  Constitutional: Negative.   Respiratory: Negative.   Cardiovascular: Negative.   Gastrointestinal: Negative.   Genitourinary: Negative.   Musculoskeletal:       Multiple joint pains       Objective:   Physical Exam  Constitutional: He is oriented to person, place, and time. He appears well-developed and well-nourished. No distress.  HENT:  Head: Normocephalic and atraumatic.  Right Ear: External ear normal.  Left Ear: External ear normal.  Mouth/Throat: Oropharynx is clear and moist. No oropharyngeal exudate.  Eyes: Conjunctivae and EOM are normal. Pupils are equal, round, and reactive to light. Right eye exhibits no discharge. Left eye exhibits no discharge. No scleral icterus.  Neck: Neck supple. No JVD present. No thyromegaly present.  Cardiovascular: Normal rate, regular rhythm, normal heart sounds and intact distal pulses.   No murmur heard. Pulmonary/Chest: Effort normal and breath sounds normal. No respiratory distress. He has no wheezes. He has no rales.  Abdominal: Soft. Bowel sounds are normal. He exhibits no distension and no mass. There is no tenderness. There is no rebound and no guarding.  Genitourinary:  No hernias to direct palpation  Musculoskeletal: He exhibits no edema.  Lymphadenopathy:    He has no cervical adenopathy.  Neurological: He is alert and oriented to person, place, and time. He has normal reflexes. No cranial nerve deficit.  Skin: Skin is warm and dry. No rash noted. He is  not diaphoretic.  Several tattoos. Left nipple ring.  Psychiatric: He has a normal mood and affect. His behavior is normal. Judgment and thought content normal.  Vitals reviewed.         Assessment & Plan:  Multiple arthralgias-rule out inflammatory arthritis. Lab work is pending. Continue Mobic.  History of hyperlipidemia-lipid panel is essentially within normal  limits off Crestor  History of essential hypertension-blood pressure normal off blood pressure medication  Hypothyroidism-TSH is within normal limits.  Anxiety-stable  Plan: Return in 6 months. At that time check TSH and fasting lipid panel along with office visit and blood pressure check.

## 2016-04-15 NOTE — Patient Instructions (Addendum)
Was pleasure to see you today. Continue same medications and return in 6 months. Continue to work on diet and exercise. Currently not taking antihypertensive medications or lipid-lowering medications which is excellent. Inflammatory arthritis studies are pending. Continue Mobic

## 2016-04-16 LAB — RHEUMATOID FACTOR: Rhuematoid fact SerPl-aCnc: <10 IU/mL (ref <=14)

## 2016-04-16 LAB — SEDIMENTATION RATE: SED RATE: 1 mm/h (ref 0–15)

## 2016-04-18 LAB — CYCLIC CITRUL PEPTIDE ANTIBODY, IGG: Cyclic Citrullin Peptide Ab: <16 Units

## 2016-04-19 LAB — ANA: Anti Nuclear Antibody(ANA): NEGATIVE

## 2016-05-02 ENCOUNTER — Emergency Department (HOSPITAL_COMMUNITY): Payer: BLUE CROSS/BLUE SHIELD | Admitting: Certified Registered Nurse Anesthetist

## 2016-05-02 ENCOUNTER — Encounter (HOSPITAL_COMMUNITY)
Admission: EM | Disposition: A | Discharge status: Home / Self Care | Payer: Self-pay | Source: Home / Self Care | Attending: Vascular Surgery

## 2016-05-02 ENCOUNTER — Encounter (HOSPITAL_COMMUNITY): Payer: Self-pay | Admitting: Nurse Practitioner

## 2016-05-02 ENCOUNTER — Emergency Department (HOSPITAL_COMMUNITY): Payer: BLUE CROSS/BLUE SHIELD

## 2016-05-02 ENCOUNTER — Inpatient Hospital Stay (HOSPITAL_COMMUNITY)
Admission: EM | Admit: 2016-05-02 | Discharge: 2016-05-03 | DRG: 253 | Disposition: A | Discharge status: Home / Self Care | Payer: BLUE CROSS/BLUE SHIELD | Attending: Vascular Surgery | Admitting: Vascular Surgery

## 2016-05-02 ENCOUNTER — Ambulatory Visit (INDEPENDENT_AMBULATORY_CARE_PROVIDER_SITE_OTHER): Payer: BLUE CROSS/BLUE SHIELD | Admitting: Emergency Medicine

## 2016-05-02 VITALS — BP 126/80 | HR 76 | Temp 98.1°F | Resp 17 | Ht 70.5 in | Wt 166.0 lb

## 2016-05-02 DIAGNOSIS — F319 Bipolar disorder, unspecified: Secondary | ICD-10-CM | POA: Diagnosis not present

## 2016-05-02 DIAGNOSIS — I743 Embolism and thrombosis of arteries of the lower extremities: Secondary | ICD-10-CM | POA: Diagnosis present

## 2016-05-02 DIAGNOSIS — E039 Hypothyroidism, unspecified: Secondary | ICD-10-CM | POA: Diagnosis not present

## 2016-05-02 DIAGNOSIS — Z91041 Radiographic dye allergy status: Secondary | ICD-10-CM

## 2016-05-02 DIAGNOSIS — E785 Hyperlipidemia, unspecified: Secondary | ICD-10-CM | POA: Diagnosis not present

## 2016-05-02 DIAGNOSIS — I999 Unspecified disorder of circulatory system: Secondary | ICD-10-CM | POA: Diagnosis not present

## 2016-05-02 DIAGNOSIS — I10 Essential (primary) hypertension: Secondary | ICD-10-CM | POA: Diagnosis present

## 2016-05-02 DIAGNOSIS — S301XXA Contusion of abdominal wall, initial encounter: Secondary | ICD-10-CM

## 2016-05-02 DIAGNOSIS — I7777 Dissection of artery of lower extremity: Secondary | ICD-10-CM | POA: Diagnosis not present

## 2016-05-02 DIAGNOSIS — F17200 Nicotine dependence, unspecified, uncomplicated: Secondary | ICD-10-CM | POA: Diagnosis present

## 2016-05-02 DIAGNOSIS — I998 Other disorder of circulatory system: Secondary | ICD-10-CM

## 2016-05-02 DIAGNOSIS — F419 Anxiety disorder, unspecified: Secondary | ICD-10-CM | POA: Diagnosis not present

## 2016-05-02 DIAGNOSIS — Z79899 Other long term (current) drug therapy: Secondary | ICD-10-CM

## 2016-05-02 DIAGNOSIS — I771 Stricture of artery: Secondary | ICD-10-CM | POA: Diagnosis not present

## 2016-05-02 HISTORY — PX: FEMORAL ARTERY EXPLORATION: SHX5160

## 2016-05-02 LAB — CBC WITH DIFFERENTIAL/PLATELET
BASOS ABS: 0 10*3/uL (ref 0.0–0.1)
BASOS PCT: 0 %
EOS PCT: 5 %
Eosinophils Absolute: 0.4 10*3/uL (ref 0.0–0.7)
HEMATOCRIT: 41 % (ref 39.0–52.0)
HEMOGLOBIN: 14 g/dL (ref 13.0–17.0)
LYMPHS ABS: 1.8 10*3/uL (ref 0.7–4.0)
Lymphocytes Relative: 20 %
MCH: 30 pg (ref 26.0–34.0)
MCHC: 34.1 g/dL (ref 30.0–36.0)
MCV: 87.8 fL (ref 78.0–100.0)
MONOS PCT: 9 %
Monocytes Absolute: 0.8 10*3/uL (ref 0.1–1.0)
Neutro Abs: 6.1 10*3/uL (ref 1.7–7.7)
Neutrophils Relative %: 66 %
Platelets: 213 10*3/uL (ref 150–400)
RBC: 4.67 MIL/uL (ref 4.22–5.81)
RDW: 13.5 % (ref 11.5–15.5)
WBC: 9.2 10*3/uL (ref 4.0–10.5)

## 2016-05-02 LAB — BASIC METABOLIC PANEL
ANION GAP: 6 (ref 5–15)
BUN: 15 mg/dL (ref 6–20)
CALCIUM: 9.1 mg/dL (ref 8.9–10.3)
CO2: 26 mmol/L (ref 22–32)
Chloride: 108 mmol/L (ref 101–111)
Creatinine, Ser: 0.68 mg/dL (ref 0.61–1.24)
GFR calc non Af Amer: >60 mL/min (ref >60)
GLUCOSE: 95 mg/dL (ref 65–99)
POTASSIUM: 3.8 mmol/L (ref 3.5–5.1)
Sodium: 140 mmol/L (ref 135–145)

## 2016-05-02 SURGERY — EXPLORATION, ARTERY, FEMORAL
Anesthesia: General | Site: Groin | Laterality: Right

## 2016-05-02 MED ORDER — DEXAMETHASONE SODIUM PHOSPHATE 10 MG/ML IJ SOLN
INTRAMUSCULAR | Status: AC
  Filled 2016-05-02: qty 1

## 2016-05-02 MED ORDER — POTASSIUM CHLORIDE CRYS ER 20 MEQ PO TBCR: 20.0000 meq | EXTENDED_RELEASE_TABLET | Freq: Every day | ORAL | Status: DC | PRN

## 2016-05-02 MED ORDER — HYDROMORPHONE HCL 1 MG/ML IJ SOLN
INTRAMUSCULAR | Status: AC
  Administered 2016-05-02: 0.5 mg via INTRAVENOUS
  Filled 2016-05-02: qty 1

## 2016-05-02 MED ORDER — GLYCOPYRROLATE 0.2 MG/ML IV SOSY
PREFILLED_SYRINGE | INTRAVENOUS | Status: AC
  Filled 2016-05-02: qty 12

## 2016-05-02 MED ORDER — FENTANYL CITRATE (PF) 100 MCG/2ML IJ SOLN
50.0000 ug | Freq: Once | INTRAMUSCULAR | Status: AC
  Administered 2016-05-02: 50 ug via INTRAVENOUS
  Filled 2016-05-02: qty 2

## 2016-05-02 MED ORDER — PROPOFOL 10 MG/ML IV BOLUS
INTRAVENOUS | Status: AC
  Filled 2016-05-02: qty 20

## 2016-05-02 MED ORDER — ONDANSETRON HCL 4 MG/2ML IJ SOLN: 4.0000 mg | Freq: Four times a day (QID) | INTRAMUSCULAR | Status: DC | PRN

## 2016-05-02 MED ORDER — HYDRALAZINE HCL 20 MG/ML IJ SOLN: 5.0000 mg | INTRAMUSCULAR | Status: DC | PRN

## 2016-05-02 MED ORDER — GUAIFENESIN-DM 100-10 MG/5ML PO SYRP: 15.0000 mL | ORAL_SOLUTION | ORAL | Status: DC | PRN

## 2016-05-02 MED ORDER — MIDAZOLAM HCL 5 MG/5ML IJ SOLN
INTRAMUSCULAR | Status: DC | PRN
  Administered 2016-05-02: 2 mg via INTRAVENOUS

## 2016-05-02 MED ORDER — DOCUSATE SODIUM 100 MG PO CAPS
100.0000 mg | ORAL_CAPSULE | Freq: Every day | ORAL | Status: DC
  Administered 2016-05-03: 100 mg via ORAL
  Filled 2016-05-02: qty 1

## 2016-05-02 MED ORDER — DEXTROSE 5 % IV SOLN
1.5000 g | INTRAVENOUS | Status: AC
  Administered 2016-05-02: 1.5 g via INTRAVENOUS

## 2016-05-02 MED ORDER — EPHEDRINE 5 MG/ML INJ
INTRAVENOUS | Status: AC
  Filled 2016-05-02: qty 10

## 2016-05-02 MED ORDER — LABETALOL HCL 5 MG/ML IV SOLN: 10.0000 mg | INTRAVENOUS | Status: DC | PRN

## 2016-05-02 MED ORDER — ROCURONIUM BROMIDE 100 MG/10ML IV SOLN
INTRAVENOUS | Status: DC | PRN
  Administered 2016-05-02: 10 mg via INTRAVENOUS
  Administered 2016-05-02: 50 mg via INTRAVENOUS

## 2016-05-02 MED ORDER — PROTAMINE SULFATE 10 MG/ML IV SOLN
INTRAVENOUS | Status: DC | PRN
  Administered 2016-05-02: 50 mg via INTRAVENOUS

## 2016-05-02 MED ORDER — LACTATED RINGERS IV SOLN
INTRAVENOUS | Status: DC | PRN
Start: 2016-05-02 — End: 2016-05-02
  Administered 2016-05-02 (×2): via INTRAVENOUS

## 2016-05-02 MED ORDER — MORPHINE SULFATE (PF) 2 MG/ML IV SOLN
2.0000 mg | INTRAVENOUS | Status: DC | PRN
  Administered 2016-05-03: 2 mg via INTRAVENOUS
  Filled 2016-05-02: qty 1

## 2016-05-02 MED ORDER — OXYCODONE-ACETAMINOPHEN 5-325 MG PO TABS
ORAL_TABLET | ORAL | Status: AC
  Filled 2016-05-02: qty 2

## 2016-05-02 MED ORDER — MAGNESIUM HYDROXIDE 400 MG/5ML PO SUSP
30.0000 mL | Freq: Every day | ORAL | Status: DC | PRN
  Filled 2016-05-02: qty 30

## 2016-05-02 MED ORDER — MELOXICAM 7.5 MG PO TABS
15.0000 mg | ORAL_TABLET | Freq: Every day | ORAL | Status: DC
  Administered 2016-05-03: 15 mg via ORAL
  Filled 2016-05-02: qty 2

## 2016-05-02 MED ORDER — FENTANYL CITRATE (PF) 250 MCG/5ML IJ SOLN
INTRAMUSCULAR | Status: AC
  Filled 2016-05-02: qty 5

## 2016-05-02 MED ORDER — PANTOPRAZOLE SODIUM 40 MG PO TBEC
40.0000 mg | DELAYED_RELEASE_TABLET | Freq: Every day | ORAL | Status: DC
  Administered 2016-05-03: 40 mg via ORAL
  Filled 2016-05-02: qty 1

## 2016-05-02 MED ORDER — SODIUM CHLORIDE 0.9 % IV SOLN
INTRAVENOUS | Status: DC
  Administered 2016-05-02: 21:00:00 via INTRAVENOUS

## 2016-05-02 MED ORDER — BISACODYL 10 MG RE SUPP: 10.0000 mg | Freq: Every day | RECTAL | Status: DC | PRN

## 2016-05-02 MED ORDER — HYDROMORPHONE HCL 1 MG/ML IJ SOLN
0.2500 mg | INTRAMUSCULAR | Status: DC | PRN
  Administered 2016-05-02 (×4): 0.5 mg via INTRAVENOUS

## 2016-05-02 MED ORDER — PROPOFOL 10 MG/ML IV BOLUS
INTRAVENOUS | Status: DC | PRN
  Administered 2016-05-02: 150 mg via INTRAVENOUS
  Administered 2016-05-02 (×2): 50 mg via INTRAVENOUS

## 2016-05-02 MED ORDER — ADULT MULTIVITAMIN W/MINERALS CH
1.0000 | ORAL_TABLET | Freq: Every day | ORAL | Status: DC
  Administered 2016-05-03: 1 via ORAL
  Filled 2016-05-02: qty 1

## 2016-05-02 MED ORDER — DEXTROSE 5 % IV SOLN
1.5000 g | Freq: Two times a day (BID) | INTRAVENOUS | Status: DC
  Administered 2016-05-03: 1.5 g via INTRAVENOUS
  Filled 2016-05-02 (×2): qty 1.5

## 2016-05-02 MED ORDER — ONDANSETRON HCL 4 MG/2ML IJ SOLN
4.0000 mg | Freq: Once | INTRAMUSCULAR | Status: AC
  Administered 2016-05-02: 4 mg via INTRAVENOUS
  Filled 2016-05-02: qty 2

## 2016-05-02 MED ORDER — METOPROLOL TARTRATE 5 MG/5ML IV SOLN: 2.0000 mg | INTRAVENOUS | Status: DC | PRN

## 2016-05-02 MED ORDER — SUCCINYLCHOLINE CHLORIDE 20 MG/ML IJ SOLN
INTRAMUSCULAR | Status: DC | PRN
  Administered 2016-05-02: 100 mg via INTRAVENOUS

## 2016-05-02 MED ORDER — PHENOL 1.4 % MT LIQD: 1.0000 | OROMUCOSAL | Status: DC | PRN

## 2016-05-02 MED ORDER — ALUM & MAG HYDROXIDE-SIMETH 200-200-20 MG/5ML PO SUSP: 15.0000 mL | ORAL | Status: DC | PRN

## 2016-05-02 MED ORDER — ACETAMINOPHEN 325 MG PO TABS: 325.0000 mg | ORAL_TABLET | ORAL | Status: DC | PRN

## 2016-05-02 MED ORDER — SUGAMMADEX SODIUM 200 MG/2ML IV SOLN
INTRAVENOUS | Status: DC | PRN
  Administered 2016-05-02: 150 mg via INTRAVENOUS

## 2016-05-02 MED ORDER — ACETAMINOPHEN 325 MG RE SUPP
325.0000 mg | RECTAL | Status: DC | PRN
Start: 2016-05-02 — End: 2016-05-03

## 2016-05-02 MED ORDER — 0.9 % SODIUM CHLORIDE (POUR BTL) OPTIME
TOPICAL | Status: DC | PRN
  Administered 2016-05-02: 1000 mL

## 2016-05-02 MED ORDER — LIDOCAINE 2% (20 MG/ML) 5 ML SYRINGE
INTRAMUSCULAR | Status: AC
  Filled 2016-05-02: qty 10

## 2016-05-02 MED ORDER — HEPARIN SODIUM (PORCINE) 1000 UNIT/ML IJ SOLN
INTRAMUSCULAR | Status: DC | PRN
  Administered 2016-05-02: 8000 [IU] via INTRAVENOUS

## 2016-05-02 MED ORDER — DEXAMETHASONE SODIUM PHOSPHATE 10 MG/ML IJ SOLN
INTRAMUSCULAR | Status: DC | PRN
  Administered 2016-05-02: 10 mg via INTRAVENOUS

## 2016-05-02 MED ORDER — MIDAZOLAM HCL 2 MG/2ML IJ SOLN
INTRAMUSCULAR | Status: AC
  Filled 2016-05-02: qty 2

## 2016-05-02 MED ORDER — ENOXAPARIN SODIUM 40 MG/0.4ML ~~LOC~~ SOLN: 40.0000 mg | SUBCUTANEOUS | Status: DC

## 2016-05-02 MED ORDER — LEVOTHYROXINE SODIUM 50 MCG PO TABS
50.0000 ug | ORAL_TABLET | Freq: Every day | ORAL | Status: DC
  Administered 2016-05-03: 50 ug via ORAL
  Filled 2016-05-02: qty 1

## 2016-05-02 MED ORDER — OXYCODONE-ACETAMINOPHEN 5-325 MG PO TABS
1.0000 | ORAL_TABLET | ORAL | Status: DC | PRN
  Administered 2016-05-02 – 2016-05-03 (×2): 2 via ORAL
  Filled 2016-05-02: qty 2

## 2016-05-02 MED ORDER — FENTANYL CITRATE (PF) 100 MCG/2ML IJ SOLN
INTRAMUSCULAR | Status: DC | PRN
  Administered 2016-05-02 (×3): 50 ug via INTRAVENOUS
  Administered 2016-05-02: 100 ug via INTRAVENOUS

## 2016-05-02 MED ORDER — SODIUM CHLORIDE 0.9 % IV SOLN: 500.0000 mL | Freq: Once | INTRAVENOUS | Status: DC | PRN

## 2016-05-02 MED ORDER — ESCITALOPRAM OXALATE 20 MG PO TABS
20.0000 mg | ORAL_TABLET | Freq: Two times a day (BID) | ORAL | Status: DC
  Administered 2016-05-03: 20 mg via ORAL
  Filled 2016-05-02: qty 1

## 2016-05-02 MED ORDER — DEXTROSE 5 % IV SOLN
INTRAVENOUS | Status: AC
  Administered 2016-05-03: 1.5 g via INTRAVENOUS
  Filled 2016-05-02: qty 1.5

## 2016-05-02 MED ORDER — LIDOCAINE HCL (CARDIAC) 20 MG/ML IV SOLN
INTRAVENOUS | Status: DC | PRN
  Administered 2016-05-02: 60 mg via INTRAVENOUS

## 2016-05-02 MED ORDER — ROCURONIUM BROMIDE 50 MG/5ML IV SOLN
INTRAVENOUS | Status: AC
  Filled 2016-05-02: qty 1

## 2016-05-02 MED ORDER — QUETIAPINE FUMARATE 50 MG PO TABS
50.0000 mg | ORAL_TABLET | Freq: Every day | ORAL | Status: DC
  Administered 2016-05-02: 50 mg via ORAL
  Filled 2016-05-02: qty 1

## 2016-05-02 MED ORDER — HEPARIN SODIUM (PORCINE) 5000 UNIT/ML IJ SOLN
INTRAMUSCULAR | Status: DC | PRN
  Administered 2016-05-02: 19:00:00

## 2016-05-02 SURGICAL SUPPLY — 50 items
APL SKNCLS STERI-STRIP NONHPOA (GAUZE/BANDAGES/DRESSINGS) ×1
BANDAGE ESMARK 6X9 LF (GAUZE/BANDAGES/DRESSINGS) IMPLANT
BENZOIN TINCTURE PRP APPL 2/3 (GAUZE/BANDAGES/DRESSINGS) ×2 IMPLANT
BNDG CMPR 9X6 STRL LF SNTH (GAUZE/BANDAGES/DRESSINGS)
BNDG ESMARK 6X9 LF (GAUZE/BANDAGES/DRESSINGS)
CANISTER SUCTION 2500CC (MISCELLANEOUS) ×2 IMPLANT
CANNULA VESSEL 3MM 2 BLNT TIP (CANNULA) ×2 IMPLANT
CLIP LIGATING EXTRA MED SLVR (CLIP) ×2 IMPLANT
CLIP LIGATING EXTRA SM BLUE (MISCELLANEOUS) ×2 IMPLANT
CLSR STERI-STRIP ANTIMIC 1/2X4 (GAUZE/BANDAGES/DRESSINGS) ×2 IMPLANT
CUFF TOURNIQUET SINGLE 34IN LL (TOURNIQUET CUFF) IMPLANT
CUFF TOURNIQUET SINGLE 44IN (TOURNIQUET CUFF) IMPLANT
DRAIN SNY 10X20 3/4 PERF (WOUND CARE) IMPLANT
DRAPE X-RAY CASS 24X20 (DRAPES) IMPLANT
DRSG COVADERM 4X10 (GAUZE/BANDAGES/DRESSINGS) IMPLANT
DRSG COVADERM 4X6 (GAUZE/BANDAGES/DRESSINGS) ×1 IMPLANT
DRSG COVADERM 4X8 (GAUZE/BANDAGES/DRESSINGS) ×1 IMPLANT
ELECT REM PT RETURN 9FT ADLT (ELECTROSURGICAL) ×2
ELECTRODE REM PT RTRN 9FT ADLT (ELECTROSURGICAL) ×1 IMPLANT
EVACUATOR SILICONE 100CC (DRAIN) IMPLANT
GAUZE SPONGE 4X4 12PLY STRL (GAUZE/BANDAGES/DRESSINGS) ×2 IMPLANT
GLOVE BIO SURGEON STRL SZ 6.5 (GLOVE) ×3 IMPLANT
GLOVE BIOGEL PI IND STRL 6.5 (GLOVE) IMPLANT
GLOVE BIOGEL PI INDICATOR 6.5 (GLOVE) ×2
GLOVE SS BIOGEL STRL SZ 7.5 (GLOVE) ×1 IMPLANT
GLOVE SUPERSENSE BIOGEL SZ 7.5 (GLOVE) ×1
GOWN STRL REUS W/ TWL LRG LVL3 (GOWN DISPOSABLE) ×3 IMPLANT
GOWN STRL REUS W/TWL LRG LVL3 (GOWN DISPOSABLE) ×6
INSERT FOGARTY SM (MISCELLANEOUS) IMPLANT
KIT BASIN OR (CUSTOM PROCEDURE TRAY) ×2 IMPLANT
KIT ROOM TURNOVER OR (KITS) ×2 IMPLANT
NS IRRIG 1000ML POUR BTL (IV SOLUTION) ×4 IMPLANT
PACK PERIPHERAL VASCULAR (CUSTOM PROCEDURE TRAY) ×2 IMPLANT
PAD ARMBOARD 7.5X6 YLW CONV (MISCELLANEOUS) ×4 IMPLANT
PADDING CAST COTTON 6X4 STRL (CAST SUPPLIES) IMPLANT
SET COLLECT BLD 21X3/4 12 (NEEDLE) IMPLANT
STAPLER VISISTAT 35W (STAPLE) IMPLANT
STOPCOCK 4 WAY LG BORE MALE ST (IV SETS) IMPLANT
STRIP CLOSURE SKIN 1/2X4 (GAUZE/BANDAGES/DRESSINGS) ×2 IMPLANT
SUT ETHILON 3 0 PS 1 (SUTURE) IMPLANT
SUT PROLENE 5 0 C 1 24 (SUTURE) ×2 IMPLANT
SUT PROLENE 6 0 CC (SUTURE) ×4 IMPLANT
SUT SILK 2 0 SH (SUTURE) ×2 IMPLANT
SUT VIC AB 2-0 CTX 36 (SUTURE) ×4 IMPLANT
SUT VIC AB 3-0 SH 27 (SUTURE) ×4
SUT VIC AB 3-0 SH 27X BRD (SUTURE) ×2 IMPLANT
TRAY FOLEY W/METER SILVER 16FR (SET/KITS/TRAYS/PACK) ×2 IMPLANT
TUBING EXTENTION W/L.L. (IV SETS) IMPLANT
UNDERPAD 30X30 INCONTINENT (UNDERPADS AND DIAPERS) ×2 IMPLANT
WATER STERILE IRR 1000ML POUR (IV SOLUTION) ×2 IMPLANT

## 2016-05-02 NOTE — Transfer of Care (Signed)
Immediate Anesthesia Transfer of Care Note  Patient: Matthew Novak  Procedure(s) Performed: Procedure(s): RIGHT GROIN EVACUATION HEMATOMA WITH BYPASS OF RIGHT SUPERFICIAL FEMORAL ARTERY USING SAPHENOUS VEIN (Right)  Patient Location: PACU  Anesthesia Type:General  Level of Consciousness: awake, alert , oriented and patient cooperative  Airway & Oxygen Therapy: Patient Spontanous Breathing and Patient connected to nasal cannula oxygen  Post-op Assessment: Report given to RN and Post -op Vital signs reviewed and stable  Post vital signs: Reviewed and stable  Last Vitals:  Filed Vitals:   05/02/16 1801 05/02/16 2049  BP: 111/68   Pulse: 67   Temp:  36.8 C  Resp: 14     Last Pain:  Filed Vitals:   05/02/16 2049  PainSc: 7          Complications: No apparent anesthesia complications
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
## 2016-05-02 NOTE — Anesthesia Preprocedure Evaluation (Addendum)
Anesthesia Evaluation  Patient identified by MRN, date of birth, ID band Patient awake    Reviewed: Allergy & Precautions, H&P , NPO status , Patient's Chart, lab work & pertinent test results  Airway Mallampati: II  TM Distance: >3 FB Neck ROM: Full    Dental no notable dental hx. (+) Dental Advisory Given, Teeth Intact   Pulmonary Current Smoker,    Pulmonary exam normal breath sounds clear to auscultation       Cardiovascular hypertension,  Rhythm:Regular Rate:Normal     Neuro/Psych PSYCHIATRIC DISORDERS Anxiety Depression Bipolar Disorder negative neurological ROS     GI/Hepatic negative GI ROS, Neg liver ROS,   Endo/Other  Hypothyroidism   Renal/GU negative Renal ROS  negative genitourinary   Musculoskeletal   Abdominal   Peds  Hematology negative hematology ROS (+)   Anesthesia Other Findings   Reproductive/Obstetrics negative OB ROS                           Anesthesia Physical Anesthesia Plan  ASA: II  Anesthesia Plan: General   Post-op Pain Management:    Induction: Intravenous  Airway Management Planned: Oral ETT  Additional Equipment:   Intra-op Plan:   Post-operative Plan: Extubation in OR  Informed Consent: I have reviewed the patients History and Physical, chart, labs and discussed the procedure including the risks, benefits and alternatives for the proposed anesthesia with the patient or authorized representative who has indicated his/her understanding and acceptance.   Dental advisory given  Plan Discussed with: CRNA  Anesthesia Plan Comments:        Anesthesia Quick Evaluation

## 2016-05-02 NOTE — ED Notes (Signed)
Dr. Donnetta Hutching MD at bedside.

## 2016-05-02 NOTE — Progress Notes (Signed)
VASCULAR LAB PRELIMINARY  PRELIMINARY  PRELIMINARY  PRELIMINARY  Right lower extremity venous duplex completed.    Preliminary report:  There is no DVT or SVT noted in the right lower extremity.   Glyndon Tursi, RVT 05/02/2016, 4:50 PM
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
## 2016-05-02 NOTE — Patient Instructions (Addendum)
Please go straight to the emergency room at  [facility] to be evaluated. Dr. Donnetta Hutching is the vascular surgeon on call and he will be available for consultation.    IF you received an x-ray today, you will receive an invoice from [facility]. Please contact [facility] at [phone] with questions or concerns regarding your invoice.   IF you received labwork today, you will receive an invoice from [employer]. Please contact [facility] at [phone] with questions or concerns regarding your invoice.   Our billing staff will not be able to assist you with questions regarding bills from these companies.  You will be contacted with the lab results as soon as they are available. The fastest way to get your results is to activate your My Chart account. Instructions are located on the last page of this paperwork. If you have not heard from [place] regarding the results in 2 weeks, please contact this office.

## 2016-05-02 NOTE — Progress Notes (Signed)
Patient ID: Matthew Novak, male   DOB: 14-Jan-1976, 40 y.o.   MRN: [id number] Noninvasive vascular lab reveals diminished ankle arm index of 0.7 on the right versus normal on the left. Large hematoma with abnormal flow in the common femoral artery at this level with a dampened flow distally.  Patient with severe contrast allergy. Explained my recommendation for exploration and evacuation of hematoma. Also explained would the confirm normal flow through the artery and may require opening of the artery passing of Fogarty catheter and potential repair. Patient understands and will proceed with surgery this evening.

## 2016-05-02 NOTE — Op Note (Signed)
OPERATIVE REPORT  DATE OF SURGERY: 05/02/2016  PATIENT: Matthew Novak, 40 y.o. male MRN: [id number]  DOB: 1976-04-08  PRE-OPERATIVE DIAGNOSIS: Occlusion right femoral artery and large hematoma secondary to recent blunt trauma  POST-OPERATIVE DIAGNOSIS:  Same  PROCEDURE: Evacuation hematoma and resection of injured superficial femoral artery replacement with reversed great saphenous vein  SURGEON:  Curt Jews, M.D.  PHYSICIAN ASSISTANT: Samantha Rhyne PA-C  ANESTHESIA:  Gen.  EBL: Less than 100 ml  Total I/O In: 1000 [I.V.:1000] Out: 100 [Blood:100]  BLOOD ADMINISTERED: None  DRAINS: None  SPECIMEN: None  COUNTS CORRECT:  YES  PLAN OF CARE: PACU   PATIENT DISPOSITION:  PACU - hemodynamically stable  PROCEDURE DETAILS: Patient was taken to the operative place supervision of the area of the right groin right leg prepped in sterile fashion incision was made over the common femoral artery and carried down the to enter a very large hematoma which was liquefied. This was evacuated. The common femoral artery at the inguinal ligament had a good pulse. There were 2 large prominent deep femoral branches and there was flow in these as well. Just distal to this there was a great deal of scarring and hematomaand there was no pulse in the superficial femoral artery. Dissection was continued further distally for approximately 6-7 cm. There was obvious injury of the superficial femoral artery and the midsection of this. The artery distally had no pulse but felt the more normal. The saphenous vein was exposed through the same incision tributary branches were ligated with 301 4-0 silk ties and divided. The vein was of excellent caliber. The patient was given 8000 units of intravenous heparin and after circulation time the common and deep femoral arteries were occluded. The distal superficial femoral artery was occluded as well. The superficial femoral artery was opened through the area of  injury and was completely thrombosed thrombosed. This was not removed and the artery was normal just below the takeoff of the deep femoral arteries. Fogarty catheter was passed up and there was no further clot from this proximal portion of the artery. Dissection was continued further past the area of injury and it was apparent that the patient had suffered a dissection which had caused an intimal flap occlusion of the artery and there is also disruption of the wall of the artery. Further distal the artery became normal again. Fogarty Fogarty catheter was passed distally and there was excellent backbleeding. This was flushed with heparinized saline and occluded. There was no further thrombus below the area of injury.A segment of saphenous vein was removed by ligating proximally and distally with 2-0 silk ties.  The vein was gently dilated with heparinized saline was excellent caliber. The vein was spatulated. The superficial femoral artery was transected and was spatulated as well. The vein was reversed and sewn into into the  Superficial femoral artery with a running 6-0 Prolene suture. This anastomosis tested and found to be adequate. The vein was then cut to appropriate length and the injured portion of the artery was transected. The artery was spatulated and the vein was cut to appropriate length and was spatulated and sewn into into the distal artery. Prior to completion of the closure the usual flushing maneuvers were undertaken. Anastomosis completed and the patient had a reversed restoration of the popliteal pulse and excellent Doppler flow at the foot. The patient was given 50 mg of protamine to reverse the heparin. Wounds irrigated with saline. Hemostasis with cautery. Wounds were closed with  2-0 Vicryl in several layers and the  Subcutaneous tissue. The skin was closed with 30 septic or Vicryl suture. Sterile dressing was applied and the patient was transferred to the recovery room in stable  condition   Curt Jews, M.D. 05/02/2016 8:39 PM
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
## 2016-05-02 NOTE — Anesthesia Postprocedure Evaluation (Signed)
Anesthesia Post Note  Patient: Bret A Bear  Procedure(s) Performed: Procedure(s) (LRB): RIGHT GROIN EVACUATION HEMATOMA WITH BYPASS OF RIGHT SUPERFICIAL FEMORAL ARTERY USING SAPHENOUS VEIN (Right)  Patient location during evaluation: PACU Anesthesia Type: General Level of consciousness: awake and alert Pain management: pain level controlled Vital Signs Assessment: post-procedure vital signs reviewed and stable Respiratory status: spontaneous breathing, nonlabored ventilation, respiratory function stable and patient connected to nasal cannula oxygen Cardiovascular status: blood pressure returned to baseline and stable Postop Assessment: no signs of nausea or vomiting Anesthetic complications: no    Last Vitals:  Filed Vitals:   05/02/16 2049 05/02/16 2100  BP:  122/78  Pulse:  80  Temp: 36.8 C   Resp:  18    Last Pain:  Filed Vitals:   05/02/16 2105  PainSc: 7                  Khylen Riolo,W. EDMOND

## 2016-05-02 NOTE — Progress Notes (Signed)
VASCULAR LAB PRELIMINARY  ARTERIAL  ABI completed:Right ABI indicates a moderate reduction in arterial flow with dampened waveforms. Left ABI is within normal limits. Duplex imaging of the right lower arterial system reveals flow in the common femoral artery with abnormal waveform.  Unable to evaluate the proximal femoral artery secondary to large hematoma.  Mid to distal femoral artery exhibits little to no flow and a "thumping" waveform is noted mid to distal femoral, popliteal, posterior tibial, peroneal, and anterior tibial arteries.  There may be a possible collateral around the hematoma to the femoral artery.     RIGHT    LEFT    PRESSURE WAVEFORM  PRESSURE WAVEFORM  BRACHIAL 113 T BRACHIAL 115 T  DP   DP    AT 84 DM AT 129 T  PT 70 DM PT 140 T  PER   PER    GREAT TOE  NA GREAT TOE  NA    RIGHT LEFT  ABI 0.73 >1     Elycia Woodside, RVT 05/02/2016, 4:52 PM

## 2016-05-03 ENCOUNTER — Encounter (HOSPITAL_COMMUNITY): Payer: Self-pay | Admitting: Vascular Surgery

## 2016-05-03 LAB — BASIC METABOLIC PANEL
Anion gap: 3 — ABNORMAL LOW (ref 5–15)
BUN: 11 mg/dL (ref 6–20)
CO2: 31 mmol/L (ref 22–32)
Calcium: 8.9 mg/dL (ref 8.9–10.3)
Chloride: 106 mmol/L (ref 101–111)
Creatinine, Ser: 0.75 mg/dL (ref 0.61–1.24)
GFR calc Af Amer: >60 mL/min (ref >60)
GLUCOSE: 130 mg/dL — AB (ref 65–99)
POTASSIUM: 4.4 mmol/L (ref 3.5–5.1)
Sodium: 140 mmol/L (ref 135–145)

## 2016-05-03 LAB — CBC
HCT: 39.1 % (ref 39.0–52.0)
HEMOGLOBIN: 12.6 g/dL — AB (ref 13.0–17.0)
MCH: 28.5 pg (ref 26.0–34.0)
MCHC: 32.2 g/dL (ref 30.0–36.0)
MCV: 88.5 fL (ref 78.0–100.0)
PLATELETS: 211 10*3/uL (ref 150–400)
RBC: 4.42 MIL/uL (ref 4.22–5.81)
RDW: 13.4 % (ref 11.5–15.5)
WBC: 7.5 10*3/uL (ref 4.0–10.5)

## 2016-05-03 LAB — VAS US LOWER EXTREMITY ARTERIAL DUPLEX: Right super femoral prox sys PSV: -56 cm/s

## 2016-05-03 LAB — MRSA PCR SCREENING: MRSA by PCR: NEGATIVE

## 2016-05-03 MED ORDER — OXYCODONE-ACETAMINOPHEN 5-325 MG PO TABS: 1.0000 | ORAL_TABLET | Freq: Four times a day (QID) | ORAL | Status: DC | PRN
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
## 2016-05-03 NOTE — Progress Notes (Signed)
Patient taken to ED to retrieve cash from safe that he brought in upon admission. Cash retrieved with security, patient then wheeled out for discharge.

## 2016-05-03 NOTE — Progress Notes (Addendum)
  Progress Note    05/03/2016 7:18 AM 1 Day Post-Op  Subjective:  Says his foot is feeling better  Afebrile HR  50's-80's NSR 0000000 systolic 0000000 RA   Filed Vitals:   05/02/16 2308 05/03/16 0311  BP: 104/68 100/71  Pulse: 66 59  Temp: 97.6 F (36.4 C) 97.8 F (36.6 C)  Resp: 16 12    Physical Exam: Cardiac:  regular Lungs:  Non labored Incisions:   Clean and dry with steri strips in place Extremities:  Right foot is warm with palpable right PT pulse   CBC    Component Value Date/Time   WBC 7.5 05/03/2016 0431   RBC 4.42 05/03/2016 0431   HGB 12.6* 05/03/2016 0431   HCT 39.1 05/03/2016 0431   PLT 211 05/03/2016 0431   MCV 88.5 05/03/2016 0431   MCH 28.5 05/03/2016 0431   MCHC 32.2 05/03/2016 0431   RDW 13.4 05/03/2016 0431   LYMPHSABS 1.8 05/02/2016 1427   MONOABS 0.8 05/02/2016 1427   EOSABS 0.4 05/02/2016 1427   BASOSABS 0.0 05/02/2016 1427    BMET    Component Value Date/Time   NA 140 05/03/2016 0431   K 4.4 05/03/2016 0431   CL 106 05/03/2016 0431   CO2 31 05/03/2016 0431   GLUCOSE 130* 05/03/2016 0431   BUN 11 05/03/2016 0431   CREATININE 0.75 05/03/2016 0431   CREATININE 0.81 04/14/2016 0901   CALCIUM 8.9 05/03/2016 0431   GFRNONAA >60 05/03/2016 0431   GFRNONAA >89 04/14/2016 0901   GFRAA >60 05/03/2016 0431   GFRAA >89 04/14/2016 0901    INR No results found for: INR   Intake/Output Summary (Last 24 hours) at 05/03/16 0718 Last data filed at 05/03/16 [id number]  Gross per 24 hour  Intake 2067.5 ml  Output    100 ml  Net 1967.5 ml     Assessment:  40 y.o. male is s/p:  Evacuation hematoma and resection of injured superficial femoral artery replacement with reversed great saphenous vein  1 Day Post-Op  Plan: -pt doing well this morning with palpable right PT pulse. -he has not ambulated yet this morning, but did well getting to the chair.  Pain is well controlled.  Pt is voiding.   -ambulate this morning-may be able to  discharge later this morning or tomorrow. -DVT prophylaxis: Lovenox to start later today   Leontine Locket, PA-C [facility] [phone] 05/03/2016 7:18 AM    I have examined the patient, reviewed and agree with above. Notable this morning. Palpable posterior tibial and dorsalis pedis pulse. Home today if comfortable ambulating  Curt Jews, MD 05/03/2016 8:24 AM

## 2016-05-03 NOTE — Progress Notes (Signed)
Patient given discharge packet, education given about follow up appointments and medication regimen. Pt and spouse state no further questions at this time. Will continue to monitor.

## 2016-05-07 ENCOUNTER — Telehealth: Payer: Self-pay | Admitting: Vascular Surgery

## 2016-05-07 NOTE — Telephone Encounter (Signed)
-----   Message from Mena Goes, RN sent at 05/03/2016 10:30 AM EDT ----- Regarding: schedule   ----- Message -----    From: Gabriel Earing, PA-C    Sent: 05/02/2016   8:36 PM      To: [facility] Charge Pool  S/p interposition vein graft right femoral artery 05/02/16.  F/u with Dr. Donnetta Hutching in 2 weeks.  Thanks, Aldona Bar

## 2016-05-07 NOTE — Telephone Encounter (Signed)
Sched appt 7/18 at 8:45. Spoke to pt to inform them of appt.

## 2016-05-17 ENCOUNTER — Encounter: Payer: Self-pay | Admitting: Vascular Surgery

## 2016-05-19 ENCOUNTER — Encounter: Payer: Self-pay | Admitting: Vascular Surgery

## 2016-05-23 ENCOUNTER — Other Ambulatory Visit: Payer: Self-pay

## 2016-05-23 MED ORDER — ESCITALOPRAM OXALATE 20 MG PO TABS: ORAL_TABLET | ORAL | Status: DC

## 2016-05-24 ENCOUNTER — Encounter: Payer: Self-pay | Admitting: Vascular Surgery

## 2016-05-24 ENCOUNTER — Ambulatory Visit (INDEPENDENT_AMBULATORY_CARE_PROVIDER_SITE_OTHER): Payer: Self-pay | Admitting: Vascular Surgery

## 2016-05-24 VITALS — BP 113/70 | HR 64 | Temp 97.1°F | Resp 16 | Ht 70.0 in | Wt 166.0 lb

## 2016-05-24 DIAGNOSIS — T8140XA Infection following a procedure, unspecified, initial encounter: Secondary | ICD-10-CM

## 2016-05-24 DIAGNOSIS — T814XXA Infection following a procedure, initial encounter: Secondary | ICD-10-CM

## 2016-05-24 DIAGNOSIS — S75001S Unspecified injury of femoral artery, right leg, sequela: Secondary | ICD-10-CM

## 2016-05-24 MED ORDER — CEPHALEXIN 500 MG PO CAPS: 500.0000 mg | ORAL_CAPSULE | Freq: Three times a day (TID) | ORAL | Status: DC

## 2016-05-24 NOTE — Progress Notes (Signed)
   Patient name: Matthew Novak MRN: [id number] DOB: November 18, 1975 Sex: male  REASON FOR VISIT: Follow-up of repair of rheumatic injury of right femoral artery  HPI: Matthew Novak is a 40 y.o. male today for follow-up of repair of traumatic tear and dissection occlusion of right superficial femoral artery and evacuation of large hematoma resulting from this blunt trauma. Surgery was on 05/02/2016. He has continued to his usual activities. He does have some drainage from his right groin incision.  Current Outpatient Prescriptions  Medication Sig Dispense Refill  . escitalopram (LEXAPRO) 20 MG tablet Take one tablet by mouth 2 times daily 60 tablet 5  . levothyroxine (SYNTHROID, LEVOTHROID) 50 MCG tablet Take 1 tablet (50 mcg total) by mouth daily. 90 tablet 1  . meloxicam (MOBIC) 15 MG tablet Take 1 tablet (15 mg total) by mouth daily. 30 tablet 5  . Multiple Vitamins-Minerals (MULTIVITAMIN WITH MINERALS) tablet Take 1 tablet by mouth daily.      . QUEtiapine (SEROQUEL) 50 MG tablet take 1 tablet by mouth once daily at bedtime 30 tablet 5  . cephALEXin (KEFLEX) 500 MG capsule Take 1 capsule (500 mg total) by mouth 3 (three) times daily. 30 capsule 0  . oxyCODONE-acetaminophen (PERCOCET/ROXICET) 5-325 MG tablet Take 1 tablet by mouth every 6 (six) hours as needed for moderate pain. (Patient not taking: Reported on 05/24/2016) 8 tablet 0   No current facility-administered medications for this visit.       PHYSICAL EXAM: Filed Vitals:   05/24/16 0852  BP: 113/70  Pulse: 64  Temp: 97.1 F (36.2 C)  Resp: 16  Height: 5\' 10"  (1.778 m)  Weight: 166 lb (75.297 kg)  SpO2: 100%    GENERAL: The patient is a well-nourished male, in no acute distress. The vital signs are documented above. 2+ posterior tibial pulse on the right and 2+ popliteal pulse. His groin does have some Vicryl suture exposed with a "stitch abscess". No evidence of fluctuance or  pus  MEDICAL ISSUES: Stable with normal vascular flow. Debrided the exposed Vicryl suture in his groin. Will place him on Keflex 500 mg 3 times a day for 10 days. We'll see him again in one month for continued follow-up  Rosetta Posner, MD [facility] Tel [phone] Pager [phone]

## 2016-06-21 ENCOUNTER — Other Ambulatory Visit: Payer: Self-pay

## 2016-06-21 MED ORDER — LEVOTHYROXINE SODIUM 50 MCG PO TABS: 50.0000 ug | ORAL_TABLET | Freq: Every day | ORAL | 0 refills | Status: DC

## 2016-06-27 ENCOUNTER — Encounter: Payer: Self-pay | Admitting: Vascular Surgery

## 2016-06-28 ENCOUNTER — Encounter: Payer: Self-pay | Admitting: Vascular Surgery

## 2016-06-28 ENCOUNTER — Ambulatory Visit (INDEPENDENT_AMBULATORY_CARE_PROVIDER_SITE_OTHER): Payer: Self-pay | Admitting: Vascular Surgery

## 2016-06-28 VITALS — BP 106/69 | HR 65 | Temp 98.1°F | Resp 16 | Ht 70.0 in | Wt 171.0 lb

## 2016-06-28 DIAGNOSIS — S75001S Unspecified injury of femoral artery, right leg, sequela: Secondary | ICD-10-CM

## 2016-06-28 NOTE — Progress Notes (Signed)
   Patient name: Matthew Novak MRN: [id number] DOB: 05/31/1976 Sex: male  REASON FOR VISIT: Follow-up traumatic right superficial artery occlusion  HPI: Matthew Novak is a 40 y.o. male today for follow-up. He had had occlusion of his right superficial femoral artery after blunt trauma to his right groin. Surgery was on 05/02/2016. He had replacement of his Injured segment with reversed great saphenous vein. He did well and was discharged home. I'd seen him several weeks ago at which time he had some reaction to the subcutaneous Vicryl suture. He was placed on Keflex and is here today for follow-up Current Outpatient Prescriptions  Medication Sig Dispense Refill  . escitalopram (LEXAPRO) 20 MG tablet Take one tablet by mouth 2 times daily 60 tablet 5  . levothyroxine (SYNTHROID, LEVOTHROID) 50 MCG tablet Take 1 tablet (50 mcg total) by mouth daily. 90 tablet 0  . meloxicam (MOBIC) 15 MG tablet Take 1 tablet (15 mg total) by mouth daily. 30 tablet 5  . Multiple Vitamins-Minerals (MULTIVITAMIN WITH MINERALS) tablet Take 1 tablet by mouth daily.      . QUEtiapine (SEROQUEL) 50 MG tablet take 1 tablet by mouth once daily at bedtime 30 tablet 5  . cephALEXin (KEFLEX) 500 MG capsule Take 1 capsule (500 mg total) by mouth 3 (three) times daily. (Patient not taking: Reported on 06/28/2016) 30 capsule 0  . oxyCODONE-acetaminophen (PERCOCET/ROXICET) 5-325 MG tablet Take 1 tablet by mouth every 6 (six) hours as needed for moderate pain. (Patient not taking: Reported on 05/24/2016) 8 tablet 0   No current facility-administered medications for this visit.      PHYSICAL EXAM: Vitals:   06/28/16 1505  BP: 106/69  Pulse: 65  Resp: 16  Temp: 98.1 F (36.7 C)  TempSrc: Oral  SpO2: 96%  Weight: 171 lb (77.6 kg)  Height: 5\' 10"  (1.778 m)    GENERAL: The patient is a well-nourished male, in no acute distress. The vital signs are documented above. Right groin incision  is completely healed with no evidence of infection. 2+ right femoral and 2-3+ right posterior tibial pulse  MEDICAL ISSUES: Stable status post repair of traumatic injury of right superficial femoral artery from blunt trauma. We'll continue his full activity with no limitation. We'll see [place] again on an as-needed basis. Did explain symptoms of claudication to him expand this would be first line if he ever had any narrowing in his bypass. Was pleased with his outcome and will see [place] on an as-needed basis   Rosetta Posner, MD [facility] Tel [phone] Pager [phone]

## 2016-10-18 ENCOUNTER — Encounter: Payer: Self-pay | Admitting: Internal Medicine

## 2016-10-18 ENCOUNTER — Ambulatory Visit (INDEPENDENT_AMBULATORY_CARE_PROVIDER_SITE_OTHER): Payer: BLUE CROSS/BLUE SHIELD | Admitting: Internal Medicine

## 2016-10-18 VITALS — BP 112/80 | HR 80 | Temp 97.5°F | Ht 70.0 in | Wt 168.0 lb

## 2016-10-18 DIAGNOSIS — Z23 Encounter for immunization: Secondary | ICD-10-CM | POA: Diagnosis not present

## 2016-10-18 DIAGNOSIS — F411 Generalized anxiety disorder: Secondary | ICD-10-CM | POA: Diagnosis not present

## 2016-10-18 DIAGNOSIS — E7800 Pure hypercholesterolemia, unspecified: Secondary | ICD-10-CM | POA: Diagnosis not present

## 2016-10-18 DIAGNOSIS — E039 Hypothyroidism, unspecified: Secondary | ICD-10-CM

## 2016-10-18 DIAGNOSIS — M2550 Pain in unspecified joint: Secondary | ICD-10-CM

## 2016-10-18 LAB — TSH: TSH: 1.4 m[IU]/L (ref 0.40–4.50)

## 2016-10-18 MED ORDER — CYCLOBENZAPRINE HCL 10 MG PO TABS: 10.0000 mg | ORAL_TABLET | Freq: Three times a day (TID) | ORAL | 2 refills | Status: DC | PRN

## 2016-10-18 NOTE — Patient Instructions (Signed)
Lab work is pending. Rheumatology consultation to be arranged. Otherwise return in 6 months for physical examination.

## 2016-10-18 NOTE — Progress Notes (Signed)
   Subjective:    Patient ID: Matthew Novak, male    DOB: August 01, 1976, 40 y.o.   MRN: [id number]  HPI 40 year old Male for 6 month follow up. Has Mobic for musculoskeletal pain. C/o pain knees ankles, neck shoulders and wrists. No hot or swollen joints. Mobic not working as well as it used to.  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  Lots of falls as a child. Knee pain since pre-teen. Says he has had MRI of knee in past by Dr. Lynann Bologna. Will try to get report.  Constant neck stiffness.  Had ACDF C5-C6 for C6 radiculopathy, right ulnar nerve neuropathy, right carpal tunnel syndrome with decompression of ulnar nerve and carpal tunnel October 2016 by Dr. Vertell Limber.  History of hyperlipidemia, hypothyroidism, anxiety disorder.  History of hypertension but currently not taking antihypertensive medication. Blood pressure was elevated when he was consuming alcohol. He has a remote history of alcohol and drug abuse but has been in recovery now for over 5 years. He had an alcohol withdrawal seizure in the remote past. At that time his neurological workup and exam by a neurologist were negative.  He used to take lipid-lowering medication but lipids have returned to normal. He feels better off Crestor.  He is allergic to x-ray contrast dye.  Family history: Father died at age 77 of ALS with history of MI and hypertension. Mother living with history of hypertension. Brother with living with history of hypertension.       Review of Systems sleeps OK with Seroquel.     Objective:   Physical Exam Examination of wrist knees shoulders and ankle show no effusions or erythema. No swelling or redness. Chest clear to auscultation. Cardiac exam regular rate and rhythm normal S1 and S2. No thyromegaly.       Assessment & Plan:  Continues with multi-joint arthralgias. Repeat rheumatology studies. Make  appointment with rheumatologist. Continue Mobic for now. Add Flexeril 10 mg at bedtime.  Hypothyroidism-TSH pending  History of hyperlipidemia-currently not on lipid-lowering medication. LDL in June was 136. Total cholesterol was 200, HDL cholesterol 48 and triglycerides were 82.  Anxiety disorder  History of alcohol abuse in recovery for over 5 years  Plan: Flu vaccine given. Return in 6 months for physical exam. Rheumatology consultation. Add Flexeril 10 mg at bedtime. Continue Mobic.

## 2016-10-19 LAB — CYCLIC CITRUL PEPTIDE ANTIBODY, IGG: Cyclic Citrullin Peptide Ab: <16 Units

## 2016-10-19 LAB — RHEUMATOID FACTOR

## 2016-10-19 LAB — ANA: Anti Nuclear Antibody(ANA): NEGATIVE

## 2016-10-19 LAB — SEDIMENTATION RATE: Sed Rate: 1 mm/hr (ref 0–15)

## 2016-10-24 ENCOUNTER — Other Ambulatory Visit: Payer: Self-pay | Admitting: Internal Medicine

## 2016-11-21 ENCOUNTER — Other Ambulatory Visit: Payer: Self-pay | Admitting: Internal Medicine

## 2016-11-28 ENCOUNTER — Other Ambulatory Visit: Payer: Self-pay | Admitting: Internal Medicine

## 2016-11-30 DIAGNOSIS — M2550 Pain in unspecified joint: Secondary | ICD-10-CM | POA: Diagnosis not present

## 2016-11-30 DIAGNOSIS — R5383 Other fatigue: Secondary | ICD-10-CM | POA: Diagnosis not present

## 2016-11-30 DIAGNOSIS — I7300 Raynaud's syndrome without gangrene: Secondary | ICD-10-CM | POA: Diagnosis not present

## 2016-11-30 DIAGNOSIS — M7989 Other specified soft tissue disorders: Secondary | ICD-10-CM | POA: Diagnosis not present

## 2016-12-25 ENCOUNTER — Other Ambulatory Visit: Payer: Self-pay | Admitting: Internal Medicine

## 2017-01-09 ENCOUNTER — Other Ambulatory Visit: Payer: Self-pay

## 2017-01-09 MED ORDER — ESCITALOPRAM OXALATE 20 MG PO TABS: ORAL_TABLET | ORAL | 5 refills | Status: DC

## 2017-01-12 DIAGNOSIS — M4690 Unspecified inflammatory spondylopathy, site unspecified: Secondary | ICD-10-CM | POA: Diagnosis not present

## 2017-01-12 DIAGNOSIS — I7300 Raynaud's syndrome without gangrene: Secondary | ICD-10-CM | POA: Diagnosis not present

## 2017-01-12 DIAGNOSIS — M5489 Other dorsalgia: Secondary | ICD-10-CM | POA: Diagnosis not present

## 2017-01-12 DIAGNOSIS — Z1589 Genetic susceptibility to other disease: Secondary | ICD-10-CM | POA: Diagnosis not present

## 2017-01-12 DIAGNOSIS — M2550 Pain in unspecified joint: Secondary | ICD-10-CM | POA: Diagnosis not present

## 2017-03-14 DIAGNOSIS — M2550 Pain in unspecified joint: Secondary | ICD-10-CM | POA: Diagnosis not present

## 2017-03-14 DIAGNOSIS — M4690 Unspecified inflammatory spondylopathy, site unspecified: Secondary | ICD-10-CM | POA: Diagnosis not present

## 2017-03-14 DIAGNOSIS — M5489 Other dorsalgia: Secondary | ICD-10-CM | POA: Diagnosis not present

## 2017-03-14 DIAGNOSIS — Z1589 Genetic susceptibility to other disease: Secondary | ICD-10-CM | POA: Diagnosis not present

## 2017-04-17 ENCOUNTER — Other Ambulatory Visit: Payer: BLUE CROSS/BLUE SHIELD | Admitting: Internal Medicine

## 2017-04-18 ENCOUNTER — Encounter: Payer: BLUE CROSS/BLUE SHIELD | Admitting: Internal Medicine

## 2017-04-18 ENCOUNTER — Telehealth: Payer: Self-pay | Admitting: Internal Medicine

## 2017-04-18 ENCOUNTER — Encounter: Payer: Self-pay | Admitting: Internal Medicine

## 2017-04-18 NOTE — Telephone Encounter (Signed)
Patient canceled his physical examination through My Chart on Friday, 04/14/2017. We were not aware of this until today 04/18/2017 when he did not show up for the appointment. He last had his physical examination and evaluation of medical problems including hypothyroidism year ago. We will not be able to refill his medications until he has his lab work done and a physical examination has been performed. I am going to send him a note to this effect. He has not rescheduled.

## 2017-05-26 ENCOUNTER — Telehealth: Payer: Self-pay

## 2017-05-26 MED ORDER — LEVOTHYROXINE SODIUM 50 MCG PO TABS: 50.0000 ug | ORAL_TABLET | Freq: Every day | ORAL | 0 refills | Status: DC

## 2017-05-26 NOTE — Telephone Encounter (Signed)
Received fax from [facility] in regards to a refill on Synthroid 50 mcg for patient. Medication was refilled per Dr. Verlene Mayer request. Sent 30 days to last until CPE in 06/2017. Pt canceled his last CPE via mychart and if cancels again we can not fill until has an appt.

## 2017-06-08 ENCOUNTER — Telehealth: Payer: Self-pay

## 2017-06-08 MED ORDER — QUETIAPINE FUMARATE 50 MG PO TABS: 50.0000 mg | ORAL_TABLET | Freq: Every day | ORAL | 0 refills | Status: DC

## 2017-06-08 NOTE — Telephone Encounter (Signed)
Refilled and pt aware °

## 2017-06-08 NOTE — Telephone Encounter (Signed)
Pts wife called and stated that they are requesting a refill on Seroquel, states pharmacy sent it on Monday but we have no record of this. Please advise

## 2017-06-08 NOTE — Telephone Encounter (Signed)
He has appt Aug 21.   Refill for 30 days only.

## 2017-06-16 DIAGNOSIS — M5489 Other dorsalgia: Secondary | ICD-10-CM | POA: Diagnosis not present

## 2017-06-16 DIAGNOSIS — Z1589 Genetic susceptibility to other disease: Secondary | ICD-10-CM | POA: Diagnosis not present

## 2017-06-16 DIAGNOSIS — M2550 Pain in unspecified joint: Secondary | ICD-10-CM | POA: Diagnosis not present

## 2017-06-16 DIAGNOSIS — M453 Ankylosing spondylitis of cervicothoracic region: Secondary | ICD-10-CM | POA: Diagnosis not present

## 2017-06-27 ENCOUNTER — Ambulatory Visit (INDEPENDENT_AMBULATORY_CARE_PROVIDER_SITE_OTHER): Payer: BLUE CROSS/BLUE SHIELD | Admitting: Internal Medicine

## 2017-06-27 ENCOUNTER — Encounter: Payer: Self-pay | Admitting: Internal Medicine

## 2017-06-27 VITALS — BP 120/70 | HR 75 | Ht 69.5 in | Wt 170.0 lb

## 2017-06-27 DIAGNOSIS — E039 Hypothyroidism, unspecified: Secondary | ICD-10-CM

## 2017-06-27 DIAGNOSIS — Z23 Encounter for immunization: Secondary | ICD-10-CM | POA: Diagnosis not present

## 2017-06-27 DIAGNOSIS — E785 Hyperlipidemia, unspecified: Secondary | ICD-10-CM | POA: Diagnosis not present

## 2017-06-27 DIAGNOSIS — Z0000 Encounter for general adult medical examination without abnormal findings: Secondary | ICD-10-CM | POA: Diagnosis not present

## 2017-06-27 DIAGNOSIS — M47899 Other spondylosis, site unspecified: Secondary | ICD-10-CM

## 2017-06-27 LAB — CBC WITH DIFFERENTIAL/PLATELET
BASOS PCT: 1 %
Basophils Absolute: 71 cells/uL (ref 0–200)
EOS PCT: 7 %
Eosinophils Absolute: 497 cells/uL (ref 15–500)
HCT: 44.9 % (ref 38.5–50.0)
HEMOGLOBIN: 15.3 g/dL (ref 13.2–17.1)
LYMPHS ABS: 2130 {cells}/uL (ref 850–3900)
Lymphocytes Relative: 30 %
MCH: 30.9 pg (ref 27.0–33.0)
MCHC: 34.1 g/dL (ref 32.0–36.0)
MCV: 90.7 fL (ref 80.0–100.0)
MONOS PCT: 9 %
MPV: 8.8 fL (ref 7.5–12.5)
Monocytes Absolute: 639 cells/uL (ref 200–950)
NEUTROS ABS: 3763 {cells}/uL (ref 1500–7800)
Neutrophils Relative %: 53 %
Platelets: 243 10*3/uL (ref 140–400)
RBC: 4.95 MIL/uL (ref 4.20–5.80)
RDW: 13.9 % (ref 11.0–15.0)
WBC: 7.1 10*3/uL (ref 3.8–10.8)

## 2017-06-27 LAB — POCT URINALYSIS DIPSTICK
Bilirubin, UA: NEGATIVE
Blood, UA: NEGATIVE
Glucose, UA: NEGATIVE
KETONES UA: NEGATIVE
Leukocytes, UA: NEGATIVE
Nitrite, UA: NEGATIVE
Protein, UA: NEGATIVE
Spec Grav, UA: 1.02 (ref 1.010–1.025)
Urobilinogen, UA: 0.2 E.U./dL
pH, UA: 6.5 (ref 5.0–8.0)

## 2017-06-27 LAB — TSH: TSH: 0.98 m[IU]/L (ref 0.40–4.50)

## 2017-06-27 NOTE — Patient Instructions (Signed)
It was a pleasure to see you today. Flu vaccine given. Labs drawn and pending. Return in 6 months.
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
## 2017-06-28 LAB — COMPREHENSIVE METABOLIC PANEL
ALBUMIN: 4.5 g/dL (ref 3.6–5.1)
ALT: 12 U/L (ref 9–46)
AST: 21 U/L (ref 10–40)
Alkaline Phosphatase: 61 U/L (ref 40–115)
BILIRUBIN TOTAL: 0.3 mg/dL (ref 0.2–1.2)
BUN: 16 mg/dL (ref 7–25)
CALCIUM: 9.1 mg/dL (ref 8.6–10.3)
CO2: 23 mmol/L (ref 20–32)
Chloride: 108 mmol/L (ref 98–110)
Creat: 0.74 mg/dL (ref 0.60–1.35)
GLUCOSE: 96 mg/dL (ref 65–99)
POTASSIUM: 4 mmol/L (ref 3.5–5.3)
Sodium: 140 mmol/L (ref 135–146)
Total Protein: 6.4 g/dL (ref 6.1–8.1)

## 2017-06-28 LAB — LIPID PANEL
CHOL/HDL RATIO: 6.5 ratio — AB (ref <5.0)
Cholesterol: 222 mg/dL — ABNORMAL HIGH (ref <200)
HDL: 34 mg/dL — AB (ref >40)
LDL CALC: 169 mg/dL — AB (ref <100)
TRIGLYCERIDES: 96 mg/dL (ref <150)
VLDL: 19 mg/dL (ref <30)

## 2017-07-06 ENCOUNTER — Telehealth: Payer: Self-pay

## 2017-07-06 MED ORDER — LEVOTHYROXINE SODIUM 50 MCG PO TABS: 50.0000 ug | ORAL_TABLET | Freq: Every day | ORAL | 5 refills | Status: DC

## 2017-07-06 NOTE — Telephone Encounter (Signed)
Received fax from [facility] in regards to a refill on Synthroid for patient. Medication was refilled per Dr. Verlene Mayer request. Sent 6 month supply

## 2017-07-12 ENCOUNTER — Telehealth: Payer: Self-pay

## 2017-07-12 MED ORDER — QUETIAPINE FUMARATE 50 MG PO TABS: 50.0000 mg | ORAL_TABLET | Freq: Every day | ORAL | 5 refills | Status: DC

## 2017-07-12 NOTE — Telephone Encounter (Signed)
Received fax from [facility] in regards to a refill on Seroquel for patient. Medication was refilled per Dr. Verlene Mayer request. Sent 6 months

## 2017-08-02 ENCOUNTER — Telehealth: Payer: Self-pay

## 2017-08-02 MED ORDER — ESCITALOPRAM OXALATE 20 MG PO TABS: ORAL_TABLET | ORAL | 5 refills | Status: DC

## 2017-08-02 NOTE — Telephone Encounter (Signed)
Received fax from [facility] in regards to a refill on Lexapro 20mg  for patient. Medication was refilled per Dr. Verlene Mayer request. Sent 6 months

## 2017-10-04 DIAGNOSIS — M5489 Other dorsalgia: Secondary | ICD-10-CM | POA: Diagnosis not present

## 2017-10-04 DIAGNOSIS — M453 Ankylosing spondylitis of cervicothoracic region: Secondary | ICD-10-CM | POA: Diagnosis not present

## 2017-10-04 DIAGNOSIS — Z1589 Genetic susceptibility to other disease: Secondary | ICD-10-CM | POA: Diagnosis not present

## 2017-10-04 DIAGNOSIS — M2550 Pain in unspecified joint: Secondary | ICD-10-CM | POA: Diagnosis not present

## 2017-10-20 DIAGNOSIS — S61211A Laceration without foreign body of left index finger without damage to nail, initial encounter: Secondary | ICD-10-CM | POA: Diagnosis not present

## 2017-10-30 DIAGNOSIS — Z4802 Encounter for removal of sutures: Secondary | ICD-10-CM | POA: Diagnosis not present

## 2017-10-30 DIAGNOSIS — S61211D Laceration without foreign body of left index finger without damage to nail, subsequent encounter: Secondary | ICD-10-CM | POA: Diagnosis not present

## 2017-11-24 ENCOUNTER — Other Ambulatory Visit: Payer: Self-pay | Admitting: Internal Medicine

## 2017-11-24 DIAGNOSIS — E039 Hypothyroidism, unspecified: Secondary | ICD-10-CM

## 2017-12-26 ENCOUNTER — Encounter: Payer: Self-pay | Admitting: Internal Medicine

## 2017-12-26 ENCOUNTER — Ambulatory Visit: Payer: BLUE CROSS/BLUE SHIELD | Admitting: Internal Medicine

## 2017-12-26 VITALS — BP 102/80 | HR 62 | Ht 69.5 in | Wt 164.0 lb

## 2017-12-26 DIAGNOSIS — E039 Hypothyroidism, unspecified: Secondary | ICD-10-CM

## 2017-12-26 DIAGNOSIS — F5101 Primary insomnia: Secondary | ICD-10-CM

## 2017-12-26 DIAGNOSIS — M47899 Other spondylosis, site unspecified: Secondary | ICD-10-CM | POA: Diagnosis not present

## 2017-12-26 DIAGNOSIS — E785 Hyperlipidemia, unspecified: Secondary | ICD-10-CM | POA: Diagnosis not present

## 2017-12-26 LAB — LIPID PANEL
CHOLESTEROL: 209 mg/dL — AB (ref <200)
HDL: 37 mg/dL — ABNORMAL LOW (ref >40)
LDL Cholesterol (Calc): 156 mg/dL (calc) — ABNORMAL HIGH
NON-HDL CHOLESTEROL (CALC): 172 mg/dL — AB (ref <130)
Total CHOL/HDL Ratio: 5.6 (calc) — ABNORMAL HIGH (ref <5.0)
Triglycerides: 63 mg/dL (ref <150)

## 2017-12-26 LAB — TSH: TSH: 1.33 mIU/L (ref 0.40–4.50)

## 2017-12-26 NOTE — Progress Notes (Signed)
   Subjective:    Patient ID: Matthew Novak, male    DOB: 1976-01-08, 42 y.o.   MRN: [id number]  HPI 42 year old Male with HLA- B27 associated spondyloarthropathy. Currently on Enbrel. History of hypothyroidism.  Has tried Humira and Cosentyx.  History of back pain and peripheral joint pain.  Sees rheumatologist.  History of anxiety and insomnia treated with Lexapro and Seroquel.  Used to take Crestor for hyperlipidemia but feels better off of it.  However in August he had total cholesterol of 222, LDL cholesterol of 169 and HDL cholesterol of 34.  A year previously total cholesterol was 200, LDL cholesterol 136 and HDL cholesterol 48.  Each time triglycerides were normal.  Lipid panel repeated today.    Review of Systems See above    Objective:   Physical Exam Blood pressure reviewed and is within normal limits.  No thyromegaly.  Chest clear.  Cardiac exam regular rate and rhythm.       Assessment & Plan:  HLA-B27 spondyloarthropathy treated with Enbrel  Hyperlipidemia-lipid panel repeated  History of anxiety and insomnia-continue same medications  Hypothyroidism-TSH drawn and pending on thyroid replacement medication  Plan: Physical exam due in late August.  Lab work will be reviewed with further instructions to follow.  Patient has had flu vaccine.

## 2017-12-26 NOTE — Patient Instructions (Signed)
Lipid panel and TSH pending.  Physical exam due late August.  May need lipid-lowering medication lipid panel results.

## 2018-01-15 ENCOUNTER — Other Ambulatory Visit: Payer: Self-pay

## 2018-01-15 MED ORDER — QUETIAPINE FUMARATE 50 MG PO TABS: 50.0000 mg | ORAL_TABLET | Freq: Every day | ORAL | 5 refills | Status: DC

## 2018-01-15 MED ORDER — LEVOTHYROXINE SODIUM 50 MCG PO TABS: 50.0000 ug | ORAL_TABLET | Freq: Every day | ORAL | 5 refills | Status: DC

## 2018-01-15 NOTE — Addendum Note (Signed)
Addended by: Mady Haagensen on: 01/15/2018 10:34 AM   Modules accepted: Orders

## 2018-02-13 ENCOUNTER — Other Ambulatory Visit: Payer: Self-pay | Admitting: Internal Medicine

## 2018-02-13 DIAGNOSIS — M453 Ankylosing spondylitis of cervicothoracic region: Secondary | ICD-10-CM | POA: Diagnosis not present

## 2018-02-13 DIAGNOSIS — Z1589 Genetic susceptibility to other disease: Secondary | ICD-10-CM | POA: Diagnosis not present

## 2018-02-13 DIAGNOSIS — M5489 Other dorsalgia: Secondary | ICD-10-CM | POA: Diagnosis not present

## 2018-02-13 DIAGNOSIS — M2550 Pain in unspecified joint: Secondary | ICD-10-CM | POA: Diagnosis not present

## 2018-02-21 DIAGNOSIS — M5416 Radiculopathy, lumbar region: Secondary | ICD-10-CM | POA: Diagnosis not present

## 2018-02-21 DIAGNOSIS — M542 Cervicalgia: Secondary | ICD-10-CM | POA: Diagnosis not present

## 2018-02-21 DIAGNOSIS — M545 Low back pain: Secondary | ICD-10-CM | POA: Diagnosis not present

## 2018-02-27 DIAGNOSIS — M5416 Radiculopathy, lumbar region: Secondary | ICD-10-CM | POA: Diagnosis not present

## 2018-02-27 DIAGNOSIS — M542 Cervicalgia: Secondary | ICD-10-CM | POA: Diagnosis not present

## 2018-02-27 DIAGNOSIS — M545 Low back pain: Secondary | ICD-10-CM | POA: Diagnosis not present

## 2018-03-02 DIAGNOSIS — S61212A Laceration without foreign body of right middle finger without damage to nail, initial encounter: Secondary | ICD-10-CM | POA: Diagnosis not present

## 2018-03-06 DIAGNOSIS — M542 Cervicalgia: Secondary | ICD-10-CM | POA: Diagnosis not present

## 2018-03-06 DIAGNOSIS — M545 Low back pain: Secondary | ICD-10-CM | POA: Diagnosis not present

## 2018-03-06 DIAGNOSIS — M5416 Radiculopathy, lumbar region: Secondary | ICD-10-CM | POA: Diagnosis not present

## 2018-03-12 DIAGNOSIS — M542 Cervicalgia: Secondary | ICD-10-CM | POA: Diagnosis not present

## 2018-03-12 DIAGNOSIS — M545 Low back pain: Secondary | ICD-10-CM | POA: Diagnosis not present

## 2018-03-12 DIAGNOSIS — M5416 Radiculopathy, lumbar region: Secondary | ICD-10-CM | POA: Diagnosis not present

## 2018-03-14 ENCOUNTER — Other Ambulatory Visit: Payer: Self-pay | Admitting: Internal Medicine

## 2018-03-19 DIAGNOSIS — M545 Low back pain: Secondary | ICD-10-CM | POA: Diagnosis not present

## 2018-03-19 DIAGNOSIS — M5416 Radiculopathy, lumbar region: Secondary | ICD-10-CM | POA: Diagnosis not present

## 2018-03-19 DIAGNOSIS — M542 Cervicalgia: Secondary | ICD-10-CM | POA: Diagnosis not present

## 2018-03-28 DIAGNOSIS — M5416 Radiculopathy, lumbar region: Secondary | ICD-10-CM | POA: Diagnosis not present

## 2018-03-28 DIAGNOSIS — M542 Cervicalgia: Secondary | ICD-10-CM | POA: Diagnosis not present

## 2018-03-28 DIAGNOSIS — M545 Low back pain: Secondary | ICD-10-CM | POA: Diagnosis not present

## 2018-04-03 DIAGNOSIS — M542 Cervicalgia: Secondary | ICD-10-CM | POA: Diagnosis not present

## 2018-04-03 DIAGNOSIS — M545 Low back pain: Secondary | ICD-10-CM | POA: Diagnosis not present

## 2018-04-03 DIAGNOSIS — M5416 Radiculopathy, lumbar region: Secondary | ICD-10-CM | POA: Diagnosis not present

## 2018-04-09 DIAGNOSIS — M545 Low back pain: Secondary | ICD-10-CM | POA: Diagnosis not present

## 2018-04-09 DIAGNOSIS — M5416 Radiculopathy, lumbar region: Secondary | ICD-10-CM | POA: Diagnosis not present

## 2018-04-09 DIAGNOSIS — M542 Cervicalgia: Secondary | ICD-10-CM | POA: Diagnosis not present

## 2018-04-16 DIAGNOSIS — M453 Ankylosing spondylitis of cervicothoracic region: Secondary | ICD-10-CM | POA: Diagnosis not present

## 2018-04-16 DIAGNOSIS — M2550 Pain in unspecified joint: Secondary | ICD-10-CM | POA: Diagnosis not present

## 2018-04-16 DIAGNOSIS — Z1589 Genetic susceptibility to other disease: Secondary | ICD-10-CM | POA: Diagnosis not present

## 2018-04-16 DIAGNOSIS — M5489 Other dorsalgia: Secondary | ICD-10-CM | POA: Diagnosis not present

## 2018-04-18 DIAGNOSIS — M5416 Radiculopathy, lumbar region: Secondary | ICD-10-CM | POA: Diagnosis not present

## 2018-04-18 DIAGNOSIS — M545 Low back pain: Secondary | ICD-10-CM | POA: Diagnosis not present

## 2018-04-18 DIAGNOSIS — M542 Cervicalgia: Secondary | ICD-10-CM | POA: Diagnosis not present

## 2018-04-21 ENCOUNTER — Other Ambulatory Visit: Payer: Self-pay | Admitting: Internal Medicine

## 2018-05-23 DIAGNOSIS — M453 Ankylosing spondylitis of cervicothoracic region: Secondary | ICD-10-CM | POA: Diagnosis not present

## 2018-06-13 DIAGNOSIS — M453 Ankylosing spondylitis of cervicothoracic region: Secondary | ICD-10-CM | POA: Diagnosis not present

## 2018-07-02 ENCOUNTER — Other Ambulatory Visit: Payer: Self-pay | Admitting: Internal Medicine

## 2018-07-02 DIAGNOSIS — E039 Hypothyroidism, unspecified: Secondary | ICD-10-CM

## 2018-07-02 DIAGNOSIS — E785 Hyperlipidemia, unspecified: Secondary | ICD-10-CM

## 2018-07-02 DIAGNOSIS — Z0000 Encounter for general adult medical examination without abnormal findings: Secondary | ICD-10-CM

## 2018-07-03 ENCOUNTER — Ambulatory Visit (INDEPENDENT_AMBULATORY_CARE_PROVIDER_SITE_OTHER): Payer: BLUE CROSS/BLUE SHIELD | Admitting: Internal Medicine

## 2018-07-03 ENCOUNTER — Encounter: Payer: Self-pay | Admitting: Internal Medicine

## 2018-07-03 VITALS — BP 120/70 | HR 85 | Ht 69.5 in | Wt 156.0 lb

## 2018-07-03 DIAGNOSIS — F411 Generalized anxiety disorder: Secondary | ICD-10-CM

## 2018-07-03 DIAGNOSIS — E785 Hyperlipidemia, unspecified: Secondary | ICD-10-CM | POA: Diagnosis not present

## 2018-07-03 DIAGNOSIS — E039 Hypothyroidism, unspecified: Secondary | ICD-10-CM

## 2018-07-03 DIAGNOSIS — M47899 Other spondylosis, site unspecified: Secondary | ICD-10-CM

## 2018-07-03 DIAGNOSIS — Z23 Encounter for immunization: Secondary | ICD-10-CM | POA: Diagnosis not present

## 2018-07-03 DIAGNOSIS — Z0000 Encounter for general adult medical examination without abnormal findings: Secondary | ICD-10-CM

## 2018-07-03 DIAGNOSIS — F439 Reaction to severe stress, unspecified: Secondary | ICD-10-CM

## 2018-07-03 DIAGNOSIS — G4709 Other insomnia: Secondary | ICD-10-CM

## 2018-07-03 LAB — POCT URINALYSIS DIPSTICK
APPEARANCE: NORMAL
Bilirubin, UA: NEGATIVE
Glucose, UA: NEGATIVE
Ketones, UA: NEGATIVE
Leukocytes, UA: NEGATIVE
NITRITE UA: NEGATIVE
Odor: NORMAL
PH UA: 6.5 (ref 5.0–8.0)
Protein, UA: NEGATIVE
RBC UA: NEGATIVE
Spec Grav, UA: 1.015 (ref 1.010–1.025)
UROBILINOGEN UA: 0.2 U/dL

## 2018-07-03 NOTE — Patient Instructions (Signed)
Labs drawn and pending.  Flu vaccine given.  Return in 1 year or as needed.  Referred to Dr. Clovis Pu for med consultation.  Continue same medications for medical issues.
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
## 2018-07-04 LAB — CBC WITH DIFFERENTIAL/PLATELET
BASOS ABS: 53 {cells}/uL (ref 0–200)
Basophils Relative: 0.7 %
EOS ABS: 304 {cells}/uL (ref 15–500)
Eosinophils Relative: 4 %
HCT: 45.8 % (ref 38.5–50.0)
HEMOGLOBIN: 15.5 g/dL (ref 13.2–17.1)
Lymphs Abs: 2082 cells/uL (ref 850–3900)
MCH: 30.2 pg (ref 27.0–33.0)
MCHC: 33.8 g/dL (ref 32.0–36.0)
MCV: 89.3 fL (ref 80.0–100.0)
MONOS PCT: 10.3 %
MPV: 9.3 fL (ref 7.5–12.5)
NEUTROS ABS: 4378 {cells}/uL (ref 1500–7800)
Neutrophils Relative %: 57.6 %
Platelets: 255 10*3/uL (ref 140–400)
RBC: 5.13 10*6/uL (ref 4.20–5.80)
RDW: 12.9 % (ref 11.0–15.0)
Total Lymphocyte: 27.4 %
WBC mixed population: 783 cells/uL (ref 200–950)
WBC: 7.6 10*3/uL (ref 3.8–10.8)

## 2018-07-04 LAB — COMPLETE METABOLIC PANEL WITH GFR
AG RATIO: 1.9 (calc) (ref 1.0–2.5)
ALBUMIN MSPROF: 4.6 g/dL (ref 3.6–5.1)
ALT: 10 U/L (ref 9–46)
AST: 20 U/L (ref 10–40)
Alkaline phosphatase (APISO): 71 U/L (ref 40–115)
BUN: 16 mg/dL (ref 7–25)
CALCIUM: 9.7 mg/dL (ref 8.6–10.3)
CO2: 26 mmol/L (ref 20–32)
Chloride: 107 mmol/L (ref 98–110)
Creat: 0.9 mg/dL (ref 0.60–1.35)
GFR, EST AFRICAN AMERICAN: 120 mL/min/{1.73_m2} (ref >=60)
GFR, EST NON AFRICAN AMERICAN: 104 mL/min/{1.73_m2} (ref >=60)
GLOBULIN: 2.4 g/dL (ref 1.9–3.7)
Glucose, Bld: 96 mg/dL (ref 65–99)
POTASSIUM: 4.4 mmol/L (ref 3.5–5.3)
Sodium: 142 mmol/L (ref 135–146)
TOTAL PROTEIN: 7 g/dL (ref 6.1–8.1)
Total Bilirubin: 0.4 mg/dL (ref 0.2–1.2)

## 2018-07-04 LAB — TSH: TSH: 2.2 m[IU]/L (ref 0.40–4.50)

## 2018-07-04 LAB — LIPID PANEL
CHOLESTEROL: 216 mg/dL — AB (ref <200)
HDL: 39 mg/dL — AB (ref >40)
LDL Cholesterol (Calc): 162 mg/dL (calc) — ABNORMAL HIGH
NON-HDL CHOLESTEROL (CALC): 177 mg/dL — AB (ref <130)
Total CHOL/HDL Ratio: 5.5 (calc) — ABNORMAL HIGH (ref <5.0)
Triglycerides: 57 mg/dL (ref <150)

## 2018-07-12 DIAGNOSIS — M453 Ankylosing spondylitis of cervicothoracic region: Secondary | ICD-10-CM | POA: Diagnosis not present

## 2018-07-13 ENCOUNTER — Other Ambulatory Visit: Payer: Self-pay | Admitting: Internal Medicine

## 2018-07-17 DIAGNOSIS — M453 Ankylosing spondylitis of cervicothoracic region: Secondary | ICD-10-CM | POA: Diagnosis not present

## 2018-07-17 DIAGNOSIS — M2550 Pain in unspecified joint: Secondary | ICD-10-CM | POA: Diagnosis not present

## 2018-07-17 DIAGNOSIS — M5489 Other dorsalgia: Secondary | ICD-10-CM | POA: Diagnosis not present

## 2018-07-17 DIAGNOSIS — Z1589 Genetic susceptibility to other disease: Secondary | ICD-10-CM | POA: Diagnosis not present

## 2018-07-22 ENCOUNTER — Other Ambulatory Visit: Payer: Self-pay | Admitting: Internal Medicine

## 2018-08-14 ENCOUNTER — Other Ambulatory Visit: Payer: Self-pay | Admitting: Internal Medicine

## 2018-08-22 ENCOUNTER — Other Ambulatory Visit: Payer: Self-pay | Admitting: Internal Medicine

## 2018-08-23 DIAGNOSIS — Z79899 Other long term (current) drug therapy: Secondary | ICD-10-CM | POA: Diagnosis not present

## 2018-08-23 DIAGNOSIS — M453 Ankylosing spondylitis of cervicothoracic region: Secondary | ICD-10-CM | POA: Diagnosis not present

## 2018-10-10 DIAGNOSIS — M453 Ankylosing spondylitis of cervicothoracic region: Secondary | ICD-10-CM | POA: Diagnosis not present

## 2018-10-16 DIAGNOSIS — Z1589 Genetic susceptibility to other disease: Secondary | ICD-10-CM | POA: Diagnosis not present

## 2018-10-16 DIAGNOSIS — M5489 Other dorsalgia: Secondary | ICD-10-CM | POA: Diagnosis not present

## 2018-10-16 DIAGNOSIS — I7300 Raynaud's syndrome without gangrene: Secondary | ICD-10-CM | POA: Diagnosis not present

## 2018-10-16 DIAGNOSIS — M453 Ankylosing spondylitis of cervicothoracic region: Secondary | ICD-10-CM | POA: Diagnosis not present

## 2018-12-10 ENCOUNTER — Telehealth: Payer: Self-pay | Admitting: Internal Medicine

## 2018-12-10 NOTE — Telephone Encounter (Signed)
Book CPE for August before refilling

## 2018-12-10 NOTE — Telephone Encounter (Signed)
Left message to call back to schedule CPE then we can refill.

## 2018-12-13 MED ORDER — ESCITALOPRAM OXALATE 20 MG PO TABS: 20.0000 mg | ORAL_TABLET | Freq: Two times a day (BID) | ORAL | 5 refills | Status: DC

## 2018-12-13 MED ORDER — LEVOTHYROXINE SODIUM 50 MCG PO TABS: 50.0000 ug | ORAL_TABLET | Freq: Every day | ORAL | 1 refills | Status: DC

## 2018-12-13 NOTE — Telephone Encounter (Signed)
Done

## 2018-12-13 NOTE — Telephone Encounter (Signed)
Wife called and booked CPE for 07/08/19 @ 10:00.    Araceli, would you please send refill to his pharmacy?    Thank you.

## 2018-12-13 NOTE — Addendum Note (Signed)
Addended by: Mady Haagensen on: 12/13/2018 04:10 PM   Modules accepted: Orders

## 2019-01-15 DIAGNOSIS — M79641 Pain in right hand: Secondary | ICD-10-CM | POA: Diagnosis not present

## 2019-01-15 DIAGNOSIS — M2550 Pain in unspecified joint: Secondary | ICD-10-CM | POA: Diagnosis not present

## 2019-01-15 DIAGNOSIS — M453 Ankylosing spondylitis of cervicothoracic region: Secondary | ICD-10-CM | POA: Diagnosis not present

## 2019-01-15 DIAGNOSIS — M5489 Other dorsalgia: Secondary | ICD-10-CM | POA: Diagnosis not present

## 2019-01-15 DIAGNOSIS — Z79899 Other long term (current) drug therapy: Secondary | ICD-10-CM | POA: Diagnosis not present

## 2019-01-15 DIAGNOSIS — Z1589 Genetic susceptibility to other disease: Secondary | ICD-10-CM | POA: Diagnosis not present

## 2019-02-16 ENCOUNTER — Other Ambulatory Visit: Payer: Self-pay | Admitting: Internal Medicine

## 2019-04-02 ENCOUNTER — Other Ambulatory Visit: Payer: Self-pay | Admitting: Internal Medicine

## 2019-07-08 ENCOUNTER — Other Ambulatory Visit: Payer: Self-pay

## 2019-07-08 ENCOUNTER — Encounter: Payer: Self-pay | Admitting: Internal Medicine

## 2019-07-08 ENCOUNTER — Ambulatory Visit (INDEPENDENT_AMBULATORY_CARE_PROVIDER_SITE_OTHER): Payer: BC Managed Care – PPO | Admitting: Internal Medicine

## 2019-07-08 VITALS — BP 100/60 | HR 60 | Temp 98.7°F | Ht 69.5 in | Wt 165.0 lb

## 2019-07-08 DIAGNOSIS — E785 Hyperlipidemia, unspecified: Secondary | ICD-10-CM | POA: Diagnosis not present

## 2019-07-08 DIAGNOSIS — Z0000 Encounter for general adult medical examination without abnormal findings: Secondary | ICD-10-CM | POA: Diagnosis not present

## 2019-07-08 DIAGNOSIS — E039 Hypothyroidism, unspecified: Secondary | ICD-10-CM | POA: Diagnosis not present

## 2019-07-08 DIAGNOSIS — M1280 Other specific arthropathies, not elsewhere classified, unspecified site: Secondary | ICD-10-CM | POA: Diagnosis not present

## 2019-07-08 DIAGNOSIS — Z23 Encounter for immunization: Secondary | ICD-10-CM

## 2019-07-08 DIAGNOSIS — F5101 Primary insomnia: Secondary | ICD-10-CM

## 2019-07-08 DIAGNOSIS — F411 Generalized anxiety disorder: Secondary | ICD-10-CM

## 2019-07-08 LAB — COMPLETE METABOLIC PANEL WITH GFR
AG Ratio: 2.1 (calc) (ref 1.0–2.5)
ALT: 9 U/L (ref 9–46)
AST: 21 U/L (ref 10–40)
Albumin: 4.1 g/dL (ref 3.6–5.1)
Alkaline phosphatase (APISO): 64 U/L (ref 36–130)
BUN: 7 mg/dL (ref 7–25)
CO2: 32 mmol/L (ref 20–32)
Calcium: 8.8 mg/dL (ref 8.6–10.3)
Chloride: 108 mmol/L (ref 98–110)
Creat: 0.85 mg/dL (ref 0.60–1.35)
GFR, Est African American: 122 mL/min/{1.73_m2} (ref >=60)
GFR, Est Non African American: 105 mL/min/{1.73_m2} (ref >=60)
Globulin: 2 g/dL (calc) (ref 1.9–3.7)
Glucose, Bld: 93 mg/dL (ref 65–99)
Potassium: 4.3 mmol/L (ref 3.5–5.3)
Sodium: 143 mmol/L (ref 135–146)
Total Bilirubin: 0.3 mg/dL (ref 0.2–1.2)
Total Protein: 6.1 g/dL (ref 6.1–8.1)

## 2019-07-08 LAB — LIPID PANEL
Cholesterol: 193 mg/dL (ref <200)
HDL: 36 mg/dL — ABNORMAL LOW (ref >=40)
LDL Cholesterol (Calc): 140 mg/dL (calc) — ABNORMAL HIGH
Non-HDL Cholesterol (Calc): 157 mg/dL (calc) — ABNORMAL HIGH (ref <130)
Total CHOL/HDL Ratio: 5.4 (calc) — ABNORMAL HIGH (ref <5.0)
Triglycerides: 73 mg/dL (ref <150)

## 2019-07-08 LAB — CBC WITH DIFFERENTIAL/PLATELET
Absolute Monocytes: 728 cells/uL (ref 200–950)
Basophils Absolute: 53 cells/uL (ref 0–200)
Basophils Relative: 0.7 %
Eosinophils Absolute: 555 cells/uL — ABNORMAL HIGH (ref 15–500)
Eosinophils Relative: 7.4 %
HCT: 45.2 % (ref 38.5–50.0)
Hemoglobin: 15.3 g/dL (ref 13.2–17.1)
Lymphs Abs: 2115 cells/uL (ref 850–3900)
MCH: 30.4 pg (ref 27.0–33.0)
MCHC: 33.8 g/dL (ref 32.0–36.0)
MCV: 89.7 fL (ref 80.0–100.0)
MPV: 9.4 fL (ref 7.5–12.5)
Monocytes Relative: 9.7 %
Neutro Abs: 4050 cells/uL (ref 1500–7800)
Neutrophils Relative %: 54 %
Platelets: 233 10*3/uL (ref 140–400)
RBC: 5.04 10*6/uL (ref 4.20–5.80)
RDW: 12.9 % (ref 11.0–15.0)
Total Lymphocyte: 28.2 %
WBC: 7.5 10*3/uL (ref 3.8–10.8)

## 2019-07-08 LAB — POCT URINALYSIS DIPSTICK
Appearance: NEGATIVE
Bilirubin, UA: NEGATIVE
Blood, UA: NEGATIVE
Glucose, UA: NEGATIVE
Ketones, UA: NEGATIVE
Leukocytes, UA: NEGATIVE
Nitrite, UA: NEGATIVE
Odor: NEGATIVE
Protein, UA: POSITIVE — AB
Spec Grav, UA: 1.015 (ref 1.010–1.025)
Urobilinogen, UA: 0.2 E.U./dL
pH, UA: 6.5 (ref 5.0–8.0)

## 2019-07-08 LAB — TSH: TSH: 0.95 mIU/L (ref 0.40–4.50)

## 2019-07-08 MED ORDER — QUETIAPINE FUMARATE 50 MG PO TABS: 50.0000 mg | ORAL_TABLET | Freq: Every day | ORAL | 1 refills | Status: DC

## 2019-07-08 NOTE — Progress Notes (Deleted)
   Subjective:    Patient ID: Matthew Novak, male    DOB: 1976/04/05, 43 y.o.   MRN: [id number]  HPI    Review of Systems     Objective:   Physical Exam        Assessment & Plan:

## 2019-07-08 NOTE — Patient Instructions (Signed)
It was a pleasure to see you today.  Continue current medications.  Labs drawn and pending.  Flu vaccine given.  Return in 1 year.
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
## 2019-07-18 DIAGNOSIS — Z1589 Genetic susceptibility to other disease: Secondary | ICD-10-CM | POA: Diagnosis not present

## 2019-07-18 DIAGNOSIS — M5489 Other dorsalgia: Secondary | ICD-10-CM | POA: Diagnosis not present

## 2019-07-18 DIAGNOSIS — M453 Ankylosing spondylitis of cervicothoracic region: Secondary | ICD-10-CM | POA: Diagnosis not present

## 2019-07-18 DIAGNOSIS — M2550 Pain in unspecified joint: Secondary | ICD-10-CM | POA: Diagnosis not present

## 2019-09-05 ENCOUNTER — Emergency Department (HOSPITAL_COMMUNITY)
Admission: EM | Admit: 2019-09-05 | Discharge: 2019-09-05 | Disposition: A | Discharge status: Home / Self Care | Payer: BC Managed Care – PPO | Attending: Pediatric Emergency Medicine | Admitting: Pediatric Emergency Medicine

## 2019-09-05 ENCOUNTER — Emergency Department (HOSPITAL_COMMUNITY): Payer: BC Managed Care – PPO

## 2019-09-05 DIAGNOSIS — Y93G1 Activity, food preparation and clean up: Secondary | ICD-10-CM | POA: Insufficient documentation

## 2019-09-05 DIAGNOSIS — S68022A Partial traumatic metacarpophalangeal amputation of left thumb, initial encounter: Secondary | ICD-10-CM | POA: Diagnosis not present

## 2019-09-05 DIAGNOSIS — Z03818 Encounter for observation for suspected exposure to other biological agents ruled out: Secondary | ICD-10-CM | POA: Diagnosis not present

## 2019-09-05 DIAGNOSIS — S68012A Complete traumatic metacarpophalangeal amputation of left thumb, initial encounter: Secondary | ICD-10-CM | POA: Diagnosis not present

## 2019-09-05 DIAGNOSIS — Z79899 Other long term (current) drug therapy: Secondary | ICD-10-CM | POA: Insufficient documentation

## 2019-09-05 DIAGNOSIS — Y9286 Slaughter house as the place of occurrence of the external cause: Secondary | ICD-10-CM | POA: Insufficient documentation

## 2019-09-05 DIAGNOSIS — Z20828 Contact with and (suspected) exposure to other viral communicable diseases: Secondary | ICD-10-CM | POA: Insufficient documentation

## 2019-09-05 DIAGNOSIS — S68119A Complete traumatic metacarpophalangeal amputation of unspecified finger, initial encounter: Secondary | ICD-10-CM

## 2019-09-05 DIAGNOSIS — W312XXA Contact with powered woodworking and forming machines, initial encounter: Secondary | ICD-10-CM | POA: Insufficient documentation

## 2019-09-05 DIAGNOSIS — F17210 Nicotine dependence, cigarettes, uncomplicated: Secondary | ICD-10-CM | POA: Insufficient documentation

## 2019-09-05 DIAGNOSIS — Y990 Civilian activity done for income or pay: Secondary | ICD-10-CM | POA: Insufficient documentation

## 2019-09-05 DIAGNOSIS — S68522A Partial traumatic transphalangeal amputation of left thumb, initial encounter: Secondary | ICD-10-CM | POA: Diagnosis not present

## 2019-09-05 DIAGNOSIS — E039 Hypothyroidism, unspecified: Secondary | ICD-10-CM | POA: Insufficient documentation

## 2019-09-05 LAB — SARS CORONAVIRUS 2 BY RT PCR (HOSPITAL ORDER, PERFORMED IN ~~LOC~~ HOSPITAL LAB): SARS Coronavirus 2: NEGATIVE

## 2019-09-05 MED ORDER — BUPIVACAINE HCL 0.25 % IJ SOLN
10.0000 mL | Freq: Once | INTRAMUSCULAR | Status: DC
  Filled 2019-09-05: qty 10

## 2019-09-05 MED ORDER — SODIUM CHLORIDE 0.9 % IV BOLUS
1000.0000 mL | Freq: Once | INTRAVENOUS | Status: AC
  Administered 2019-09-05: 12:00:00 1000 mL via INTRAVENOUS

## 2019-09-05 MED ORDER — FENTANYL CITRATE (PF) 100 MCG/2ML IJ SOLN
50.0000 ug | Freq: Once | INTRAMUSCULAR | Status: DC
  Filled 2019-09-05: qty 2

## 2019-09-05 MED ORDER — SODIUM CHLORIDE 0.9 % IV SOLN
INTRAVENOUS | Status: DC | PRN
  Administered 2019-09-05: 50 mL via INTRAVENOUS

## 2019-09-05 MED ORDER — HYDROMORPHONE HCL 1 MG/ML IJ SOLN
0.5000 mg | INTRAMUSCULAR | Status: DC | PRN
  Administered 2019-09-05: 0.5 mg via INTRAVENOUS
  Filled 2019-09-05: qty 1

## 2019-09-05 MED ORDER — CEFAZOLIN SODIUM-DEXTROSE 2-4 GM/100ML-% IV SOLN
2.0000 g | Freq: Once | INTRAVENOUS | Status: AC
  Administered 2019-09-05: 2 g via INTRAVENOUS
  Filled 2019-09-05 (×2): qty 100

## 2019-09-05 MED ORDER — HYDROMORPHONE HCL 1 MG/ML IJ SOLN
1.0000 mg | Freq: Once | INTRAMUSCULAR | Status: AC
  Administered 2019-09-05: 1 mg via INTRAVENOUS
  Filled 2019-09-05: qty 1

## 2019-09-05 MED ORDER — BUPIVACAINE HCL (PF) 0.25 % IJ SOLN
10.0000 mL | Freq: Once | INTRAMUSCULAR | Status: DC
  Filled 2019-09-05: qty 30

## 2019-09-05 MED ORDER — CEPHALEXIN 500 MG PO CAPS: 500.0000 mg | ORAL_CAPSULE | Freq: Four times a day (QID) | ORAL | 0 refills | Status: AC

## 2019-09-05 MED ORDER — HYDROCODONE-ACETAMINOPHEN 5-325 MG PO TABS: 2.0000 | ORAL_TABLET | ORAL | 0 refills | Status: DC | PRN

## 2019-09-05 MED ORDER — NALOXONE HCL 2 MG/2ML IJ SOSY
PREFILLED_SYRINGE | INTRAMUSCULAR | Status: AC
  Filled 2019-09-05: qty 2

## 2019-09-05 NOTE — ED Provider Notes (Signed)
[facility] EMERGENCY DEPARTMENT Provider Note   CSN: [id number] Arrival date & time: 09/05/19  1002     History   Chief Complaint Chief Complaint  Patient presents with   Finger Injury    HPI Matthew Novak is a 43 y.o. male right-hand-dominant today following injury of his left thumb.  Patient reports that approximately 1 hour prior to arrival he was cutting ox tail on a band saw while at work when he accidentally cut his left thumb.  Pain was immediate sharp throbbing constant worsened with palpation and improved with rest, nonradiating and severe in intensity.  Patient applied direct pressure immediately, placed his thumb tip on ice and came to this ED for evaluation.  Patient reports last time he ate was approximately 9 PM last night.  Patient reports Tdap up-to-date within 5 years.  Denies fever/chills, headache, vision changes, chest pain, abdominal pain, nausea/vomiting, numbness/tingling, weakness or any additional concerns or injuries.     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   Bilateral hand pain 12/08/2014   Hyperlipidemia 09/01/2014   Hypothyroidism 03/03/2014   Insomnia 12/16/2013   Low testosterone 03/09/2013   Recovering alcoholic in remission ([place]) 03/09/2013   Anxiety 09/12/2011   Depression 03/29/2011    Past Surgical History:  Procedure Laterality Date   FEMORAL ARTERY EXPLORATION Right 05/02/2016   Procedure: RIGHT GROIN EVACUATION HEMATOMA WITH BYPASS OF RIGHT SUPERFICIAL FEMORAL ARTERY USING SAPHENOUS VEIN;  Surgeon: Rosetta Posner, MD;  Location: [facility] OR;  Service: Vascular;  Laterality: Right;   MOUTH SURGERY          Home Medications    Prior to Admission medications   Medication Sig Start Date End Date Taking? Authorizing Provider  cephALEXin (KEFLEX) 500 MG capsule Take 1 capsule (500  mg total) by mouth 4 (four) times daily for 3 days. 09/05/19 09/08/19  Nuala Alpha A, PA-C  escitalopram (LEXAPRO) 20 MG tablet Take 1 tablet (20 mg total) by mouth 2 (two) times daily. 12/13/18   Elby Showers, MD  HYDROcodone-acetaminophen (NORCO/VICODIN) 5-325 MG tablet Take 2 tablets by mouth every 4 (four) hours as needed. 09/05/19   Nuala Alpha A, PA-C  levothyroxine (SYNTHROID) 50 MCG tablet TAKE 1 TABLET BY MOUTH EVERY DAY 04/02/19   Elby Showers, MD  QUEtiapine (SEROQUEL) 50 MG tablet Take 1 tablet (50 mg total) by mouth at bedtime. 07/08/19   Elby Showers, MD    Family History Family History  Problem Relation Age of Onset   Hypertension Mother    Heart disease Father    Hypertension Father    Hypertension Brother     Social History Social History   Tobacco Use   Smoking status: Current Every Day Smoker    Packs/day: 0.50    Years: 26.00    Pack years: 13.00    Types: Cigarettes   Smokeless tobacco: Never Used  Substance Use Topics   Alcohol use: No    Alcohol/week: 0.0 standard drinks   Drug use: No     Allergies   Contrast media [iodinated diagnostic agents]   Review of Systems Review of Systems Ten systems are reviewed and are negative for acute change except as noted in the HPI   Physical Exam Updated Vital Signs BP 122/65 (BP Location: Right Arm)    Pulse (!) 59  Temp 98 F (36.7 C) (Oral)    Resp 17    Ht 5' 9"  (1.753 m)    Wt 72.6 kg    SpO2 99%    BMI 23.63 kg/m   Physical Exam Constitutional:      General: He is not in acute distress.    Appearance: Normal appearance. He is well-developed. He is not ill-appearing or diaphoretic.  HENT:     Head: Normocephalic and atraumatic.     Right Ear: External ear normal.     Left Ear: External ear normal.     Nose: Nose normal.  Eyes:     General: Vision grossly intact. Gaze aligned appropriately.     Pupils: Pupils are equal, round, and reactive to light.  Neck:      Musculoskeletal: Normal range of motion.     Trachea: Trachea and phonation normal. No tracheal deviation.  Pulmonary:     Effort: Pulmonary effort is normal. No respiratory distress.  Abdominal:     General: There is no distension.     Palpations: Abdomen is soft.     Tenderness: There is no abdominal tenderness. There is no guarding or rebound.  Musculoskeletal: Normal range of motion.       Hands:     Comments: Left hand: Partial amputation of the left thumb, angulated from lateral tip proximally towards medial aspect.  Bone edge visible.  Oozing blood.  Sensation intact.  Tender to palpation.  Flexion and extension of thumb intact. Otherwise hand appears normal without tenderness or pain.  Appropriate range of motion and strength of all other fingers with sensation and capillary refill intact.  Strong radial pulse.  Skin:    General: Skin is warm and dry.  Neurological:     Mental Status: He is alert.     GCS: GCS eye subscore is 4. GCS verbal subscore is 5. GCS motor subscore is 6.     Comments: Speech is clear and goal oriented, follows commands Major Cranial nerves without deficit, no facial droop Moves extremities without ataxia, coordination intact  Psychiatric:        Behavior: Behavior normal.    ED Treatments / Results  Labs (all labs ordered are listed, but only abnormal results are displayed) Labs Reviewed  SARS CORONAVIRUS 2 BY RT PCR (HOSPITAL ORDER, [facility])    EKG None  Radiology Dg Hand Complete Left  Result Date: 09/05/2019 CLINICAL DATA:  Partial amputation after injury with saw at work. EXAM: LEFT HAND - COMPLETE 3+ VIEW COMPARISON:  None. FINDINGS: Status post traumatic amputation of distal tuft of first distal phalanx as well as surrounding soft tissues. No radiopaque foreign body is noted. No other bony abnormality is noted. Joint spaces are intact. IMPRESSION: Status post traumatic amputation of distal tuft of first distal  phalanx as well as surrounding soft tissues. Electronically Signed   By: Marijo Conception M.D.   On: 09/05/2019 10:42    Procedures .Nerve Block  Date/Time: 09/05/2019 3:07 PM Performed by: Deliah Boston, PA-C Authorized by: Deliah Boston, PA-C   Consent:    Consent obtained:  Verbal   Consent given by:  Patient and spouse   Risks discussed:  Allergic reaction, infection, bleeding, intravenous injection, pain, nerve damage, swelling and unsuccessful block Indications:    Indications:  Pain relief Location:    Body area:  Upper extremity   Upper extremity nerve blocked: Digital (Thumb)   Laterality:  Left Pre-procedure details:  Skin preparation:  Povidone-iodine   Preparation: Patient was prepped and draped in usual sterile fashion   Procedure details (see MAR for exact dosages):    Block needle gauge:  25 G   Anesthetic injected:  Bupivacaine 0.25% w/o epi   Additive injected:  None   Injection procedure:  Anatomic landmarks identified, incremental injection, negative aspiration for blood, introduced needle and anatomic landmarks palpated Post-procedure details:    Outcome:  Pain relieved   Patient tolerance of procedure:  Tolerated well, no immediate complications Comments:     Wound dressed by nursing staff post procedure.   (including critical care time)  Medications Ordered in ED Medications  0.9 %  sodium chloride infusion (50 mLs Intravenous New Bag/Given 09/05/19 1255)  bupivacaine (PF) (MARCAINE) 0.25 % injection 10 mL (has no administration in time range)  ceFAZolin (ANCEF) IVPB 2g/100 mL premix (0 g Intravenous Stopped 09/05/19 1414)  sodium chloride 0.9 % bolus 1,000 mL (0 mLs Intravenous Stopped 09/05/19 1248)  HYDROmorphone (DILAUDID) injection 1 mg (1 mg Intravenous Given 09/05/19 1255)    Initial Impression / Assessment and Plan / ED Course  I have reviewed the triage vital signs and the nursing notes.  Pertinent labs & imaging results that  were available during my care of the patient were reviewed by me and considered in my medical decision making (see chart for details).     DG Hand:  IMPRESSION:  Status post traumatic amputation of distal tuft of first distal  phalanx as well as surrounding soft tissues.   11:23 AM: Discussed case with orthopedic hand Hilbert Odor, PA-C who is coming to see patient. - Patient seen and evaluated by hand surgery PA Hilbert Odor, patient to be taken to surgery later on this afternoon, surgery team advises n.p.o. until that time, they have asked for rapid Covid test.  They advise no blood work needed, has asked for Ancef here in the ED.  Pain medication changed to Dilaudid by surgery team. - 12:05 PM: Received update from Hilbert Odor, PA-C who advises that OR schedule is full today.  Patient is to follow-up with Dr. Shelbie Ammons office tomorrow morning for repair as elective outpatient surgery.  Surgery team advises a Marcaine digital block and discharge with Keflex. - COVID-19 negative - Patient has completed 2 g Ancef.  Prescription for Keflex 500 mg 4 times daily provided for the next 3 days as he should see the hand surgeon before this prescription runs out for further evaluation and treatment.  Denies history of CKD or gastric ulcers.  Norco prescribed for pain control, review of PMDP shows no history of controlled substance prescriptions.  I discussed narcotic precautions with patient and his wife and they state understanding.  Patient states understanding to call Dr. Shelbie Ammons office first thing tomorrow morning to schedule a follow-up appointment address and phone number given.  Patient advised to remain n.p.o. after midnight tonight in case Dr. Jeannie Fend takes him to the OR tomorrow.  Successful digital block performed.  Dressing placed by nursing staff.  Patient requesting discharge.  At this time there does not appear to be any evidence of an acute emergency medical condition and  the patient appears stable for discharge with appropriate outpatient follow up. Diagnosis was discussed with patient who verbalizes understanding of care plan and is agreeable to discharge. I have discussed return precautions with patient and wife who verbalizes understanding of return precautions. Patient encouraged to follow-up with their PCP and hand. All questions answered.  Patient's  case discussed with Dr. Adair Laundry who agrees with plan.  Note: Portions of this report may have been transcribed using voice recognition software. Every effort was made to ensure accuracy; however, inadvertent computerized transcription errors may still be present. Final Clinical Impressions(s) / ED Diagnoses   Final diagnoses:  Traumatic amputation of tip of finger, initial encounter    ED Discharge Orders         Ordered    HYDROcodone-acetaminophen (NORCO/VICODIN) 5-325 MG tablet  Every 4 hours PRN     09/05/19 1458    cephALEXin (KEFLEX) 500 MG capsule  4 times daily     09/05/19 1458           Gari Crown 09/05/19 1512    [name] Bulla, MD 09/05/19 1547

## 2019-09-05 NOTE — Discharge Instructions (Addendum)
You have been diagnosed today with traumatic amputation of the tip of the left thumb.  At this time there does not appear to be the presence of an emergent medical condition, however there is always the potential for conditions to change. Please read and follow the below instructions.  Please return to the Emergency Department immediately for any new or worsening symptoms. Please be sure to follow up with your Primary Care Provider within one week regarding your visit today; please call their office to schedule an appointment even if you are feeling better for a follow-up visit. Please call the hand specialist Dr. Shelbie Ammons office first thing tomorrow morning to schedule an appointment, he will want to see you in his office tomorrow morning for further treatment of your injury.  Do not eat after midnight tonight as you may be needing surgery tomorrow depending on Dr. Shelbie Ammons plan. Please continue taking the antibiotic Keflex as prescribed to avoid infection. You may use the pain medication Norco as prescribed for severe pain.  Do not drink alcohol or take other sedating medications with Norco as this may worsen side effects.  Do not drive or perform any other potentially dangerous activities while taking Norco as this medication may make you drowsy.  Get help right away if: You have redness spreading or extending from your wound. You have fever or chills You have drainage or bleeding not controlled by the bandaging today. You have any new/concerning or worsening symptoms.  Please read the additional information packets attached to your discharge summary.  Do not take your medicine if  develop an itchy rash, swelling in your mouth or lips, or difficulty breathing; call 911 and seek immediate emergency medical attention if this occurs.  Note: Portions of this text may have been transcribed using voice recognition software. Every effort was made to ensure accuracy; however, inadvertent  computerized transcription errors may still be present.

## 2019-09-05 NOTE — Consult Note (Signed)
Reason for Consult:Left thumb amputation Referring Physician: R Reichert  Matthew Novak is an 43 y.o. male.  HPI: Matthew Novak was cutting meat with a band saw and got his left thumb. He came to the ED for evaluation and hand surgery was consulted. 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  Past Medical History:  Diagnosis Date  . Hyperlipidemia     Past Surgical History:  Procedure Laterality Date  . FEMORAL ARTERY EXPLORATION Right 05/02/2016   Procedure: RIGHT GROIN EVACUATION HEMATOMA WITH BYPASS OF RIGHT SUPERFICIAL FEMORAL ARTERY USING SAPHENOUS VEIN;  Surgeon: Rosetta Posner, MD;  Location: [facility];  Service: Vascular;  Laterality: Right;  . MOUTH SURGERY      Family History  Problem Relation Age of Onset  . Hypertension Mother   . Heart disease Father   . Hypertension Father   . Hypertension Brother     Social History:  reports that he has been smoking cigarettes. He has a 13.00 pack-year smoking history. He has never used smokeless tobacco. He reports that he does not drink alcohol or use drugs.  Allergies:  Allergies  Allergen Reactions  . Contrast Media [Iodinated Diagnostic Agents] Hives    Medications: I have reviewed the patient's current medications.  No results found for this or any previous visit (from the past 48 hour(s)).  Dg Hand Complete Left  Result Date: 09/05/2019 CLINICAL DATA:  Partial amputation after injury with saw at work. EXAM: LEFT HAND - COMPLETE 3+ VIEW COMPARISON:  None. FINDINGS: Status post traumatic amputation of distal tuft of first distal phalanx as well as surrounding soft tissues. No radiopaque foreign body is noted. No other bony abnormality is noted. Joint spaces are intact. IMPRESSION: Status post traumatic amputation of distal tuft of first distal phalanx as well as surrounding soft tissues. Electronically Signed   By: Marijo Conception M.D.   On: 09/05/2019 10:42    Review of Systems  Constitutional: Negative for weight loss.  HENT: Negative  for ear discharge, ear pain, hearing loss and tinnitus.   Eyes: Negative for blurred vision, double vision, photophobia and pain.  Respiratory: Negative for cough, sputum production and shortness of breath.   Cardiovascular: Negative for chest pain.  Gastrointestinal: Negative for abdominal pain, nausea and vomiting.  Genitourinary: Negative for dysuria, flank pain, frequency and urgency.  Musculoskeletal: Positive for joint pain (Left thumb). Negative for back pain, falls, myalgias and neck pain.  Neurological: Negative for dizziness, tingling, sensory change, focal weakness, loss of consciousness and headaches.  Endo/Heme/Allergies: Does not bruise/bleed easily.  Psychiatric/Behavioral: Negative for depression, memory loss and substance abuse. The patient is not nervous/anxious.    Blood pressure 122/65, pulse (!) 59, temperature 98 F (36.7 C), temperature source Oral, resp. rate 17, height 5\' 9"  (1.753 m), weight 72.6 kg, SpO2 99 %. Physical Exam  Constitutional: He appears well-developed and well-nourished. No distress.  HENT:  Head: Normocephalic and atraumatic.  Eyes: Conjunctivae are normal. Right eye exhibits no discharge. Left eye exhibits no discharge. No scleral icterus.  Neck: Normal range of motion.  Cardiovascular: Normal rate and regular rhythm.  Respiratory: Effort normal. No respiratory distress.  Musculoskeletal:     Comments: Left shoulder, elbow, wrist, digits- Amputation thumb tip , mod TTP, no instability, no blocks to motion  Sens  Ax/R/M/U intact  Mot   Ax/ R/ PIN/ M/ AIN/ U intact  Rad 2+  Neurological: He is alert.  Skin: Skin is warm and dry. He is not diaphoretic.  Psychiatric: He has a normal mood and affect. His behavior is normal.      Assessment/Plan: Left thumb amputation -- Will need revision amputation by Dr. Jeannie Fend but will do electively. [place] for discharge. Depression Thyroid disease    Lisette Abu, PA-C Orthopedic Surgery  [phone] 09/05/2019, 11:43 AM

## 2019-09-05 NOTE — ED Triage Notes (Signed)
Pt to ER for evaluation of left partial thumb amputation. 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 He is no distress. Bleeding is controlled. He has the missing part with him. Placed in a bag and on ice.

## 2019-09-05 NOTE — ED Notes (Signed)
Antibiotic completed.  Patient with no s/sx of adverse reaction.  Dressing remains dry and intact

## 2019-09-06 ENCOUNTER — Encounter (HOSPITAL_BASED_OUTPATIENT_CLINIC_OR_DEPARTMENT_OTHER): Payer: Self-pay | Admitting: *Deleted

## 2019-09-06 ENCOUNTER — Other Ambulatory Visit: Payer: Self-pay

## 2019-09-06 DIAGNOSIS — M79645 Pain in left finger(s): Secondary | ICD-10-CM | POA: Diagnosis not present

## 2019-09-09 ENCOUNTER — Ambulatory Visit (HOSPITAL_BASED_OUTPATIENT_CLINIC_OR_DEPARTMENT_OTHER): Payer: BC Managed Care – PPO | Admitting: Certified Registered"

## 2019-09-09 ENCOUNTER — Encounter (HOSPITAL_BASED_OUTPATIENT_CLINIC_OR_DEPARTMENT_OTHER)
Admission: RE | Disposition: A | Discharge status: Home / Self Care | Payer: Self-pay | Source: Home / Self Care | Attending: Orthopaedic Surgery

## 2019-09-09 ENCOUNTER — Ambulatory Visit (HOSPITAL_BASED_OUTPATIENT_CLINIC_OR_DEPARTMENT_OTHER)
Admission: RE | Admit: 2019-09-09 | Discharge: 2019-09-09 | Disposition: A | Discharge status: Home / Self Care | Payer: BC Managed Care – PPO | Attending: Orthopaedic Surgery | Admitting: Orthopaedic Surgery

## 2019-09-09 ENCOUNTER — Other Ambulatory Visit: Payer: Self-pay

## 2019-09-09 ENCOUNTER — Encounter (HOSPITAL_BASED_OUTPATIENT_CLINIC_OR_DEPARTMENT_OTHER): Payer: Self-pay | Admitting: Certified Registered"

## 2019-09-09 DIAGNOSIS — I10 Essential (primary) hypertension: Secondary | ICD-10-CM | POA: Diagnosis not present

## 2019-09-09 DIAGNOSIS — F17210 Nicotine dependence, cigarettes, uncomplicated: Secondary | ICD-10-CM | POA: Insufficient documentation

## 2019-09-09 DIAGNOSIS — F419 Anxiety disorder, unspecified: Secondary | ICD-10-CM | POA: Diagnosis not present

## 2019-09-09 DIAGNOSIS — S68022A Partial traumatic metacarpophalangeal amputation of left thumb, initial encounter: Secondary | ICD-10-CM | POA: Insufficient documentation

## 2019-09-09 DIAGNOSIS — Z91041 Radiographic dye allergy status: Secondary | ICD-10-CM | POA: Diagnosis not present

## 2019-09-09 DIAGNOSIS — F329 Major depressive disorder, single episode, unspecified: Secondary | ICD-10-CM | POA: Diagnosis not present

## 2019-09-09 DIAGNOSIS — W312XXA Contact with powered woodworking and forming machines, initial encounter: Secondary | ICD-10-CM | POA: Insufficient documentation

## 2019-09-09 DIAGNOSIS — Z79899 Other long term (current) drug therapy: Secondary | ICD-10-CM | POA: Insufficient documentation

## 2019-09-09 DIAGNOSIS — E785 Hyperlipidemia, unspecified: Secondary | ICD-10-CM | POA: Diagnosis not present

## 2019-09-09 DIAGNOSIS — E039 Hypothyroidism, unspecified: Secondary | ICD-10-CM | POA: Diagnosis not present

## 2019-09-09 DIAGNOSIS — Z79890 Hormone replacement therapy (postmenopausal): Secondary | ICD-10-CM | POA: Diagnosis not present

## 2019-09-09 DIAGNOSIS — M1990 Unspecified osteoarthritis, unspecified site: Secondary | ICD-10-CM | POA: Insufficient documentation

## 2019-09-09 DIAGNOSIS — S68522A Partial traumatic transphalangeal amputation of left thumb, initial encounter: Secondary | ICD-10-CM | POA: Diagnosis not present

## 2019-09-09 HISTORY — PX: INCISION AND DRAINAGE OF WOUND: SHX1803

## 2019-09-09 HISTORY — DX: Depression, unspecified: F32.A

## 2019-09-09 HISTORY — DX: Anxiety disorder, unspecified: F41.9

## 2019-09-09 HISTORY — DX: Hypothyroidism, unspecified: E03.9

## 2019-09-09 HISTORY — DX: Unspecified osteoarthritis, unspecified site: M19.90

## 2019-09-09 HISTORY — PX: GRAFT APPLICATION: SHX6696

## 2019-09-09 SURGERY — IRRIGATION AND DEBRIDEMENT WOUND
Anesthesia: Monitor Anesthesia Care | Site: Thumb | Laterality: Left

## 2019-09-09 MED ORDER — CEFAZOLIN SODIUM-DEXTROSE 2-4 GM/100ML-% IV SOLN
INTRAVENOUS | Status: AC
  Filled 2019-09-09: qty 100

## 2019-09-09 MED ORDER — FENTANYL CITRATE (PF) 100 MCG/2ML IJ SOLN
100.0000 ug | INTRAMUSCULAR | Status: DC | PRN
  Administered 2019-09-09: 50 ug via INTRAVENOUS

## 2019-09-09 MED ORDER — BUPIVACAINE HCL (PF) 0.5 % IJ SOLN
INTRAMUSCULAR | Status: AC
  Filled 2019-09-09: qty 90

## 2019-09-09 MED ORDER — HYDROMORPHONE HCL 1 MG/ML IJ SOLN: 0.2500 mg | INTRAMUSCULAR | Status: DC | PRN

## 2019-09-09 MED ORDER — ONDANSETRON HCL 4 MG/2ML IJ SOLN: 4.0000 mg | Freq: Once | INTRAMUSCULAR | Status: DC | PRN

## 2019-09-09 MED ORDER — FENTANYL CITRATE (PF) 100 MCG/2ML IJ SOLN
INTRAMUSCULAR | Status: AC
  Filled 2019-09-09: qty 2

## 2019-09-09 MED ORDER — HYDROCODONE-ACETAMINOPHEN 5-325 MG PO TABS: 1.0000 | ORAL_TABLET | ORAL | 0 refills | Status: DC | PRN

## 2019-09-09 MED ORDER — LIDOCAINE 2% (20 MG/ML) 5 ML SYRINGE
INTRAMUSCULAR | Status: DC | PRN
  Administered 2019-09-09: 40 mg via INTRAVENOUS

## 2019-09-09 MED ORDER — MIDAZOLAM HCL 2 MG/2ML IJ SOLN
INTRAMUSCULAR | Status: AC
  Filled 2019-09-09: qty 2

## 2019-09-09 MED ORDER — PROPOFOL 10 MG/ML IV BOLUS
INTRAVENOUS | Status: DC | PRN
  Administered 2019-09-09: 40 mg via INTRAVENOUS

## 2019-09-09 MED ORDER — OXYCODONE HCL 5 MG PO TABS: 5.0000 mg | ORAL_TABLET | Freq: Once | ORAL | Status: DC | PRN

## 2019-09-09 MED ORDER — CEFAZOLIN SODIUM-DEXTROSE 2-4 GM/100ML-% IV SOLN
2.0000 g | INTRAVENOUS | Status: AC
  Administered 2019-09-09: 2 g via INTRAVENOUS

## 2019-09-09 MED ORDER — LACTATED RINGERS IV SOLN
INTRAVENOUS | Status: DC
  Administered 2019-09-09: 09:00:00 via INTRAVENOUS

## 2019-09-09 MED ORDER — CHLORHEXIDINE GLUCONATE 4 % EX LIQD: 60.0000 mL | Freq: Once | CUTANEOUS | Status: DC

## 2019-09-09 MED ORDER — OXYCODONE HCL 5 MG/5ML PO SOLN: 5.0000 mg | Freq: Once | ORAL | Status: DC | PRN

## 2019-09-09 MED ORDER — LIDOCAINE 2% (20 MG/ML) 5 ML SYRINGE
INTRAMUSCULAR | Status: AC
  Filled 2019-09-09: qty 5

## 2019-09-09 MED ORDER — LIDOCAINE HCL 1 % IJ SOLN
INTRAMUSCULAR | Status: DC | PRN
  Administered 2019-09-09: 10 mL via INTRAMUSCULAR

## 2019-09-09 MED ORDER — LIDOCAINE HCL (PF) 1 % IJ SOLN
INTRAMUSCULAR | Status: AC
  Filled 2019-09-09: qty 30

## 2019-09-09 MED ORDER — MIDAZOLAM HCL 2 MG/2ML IJ SOLN
1.0000 mg | INTRAMUSCULAR | Status: DC | PRN
  Administered 2019-09-09: 1 mg via INTRAVENOUS

## 2019-09-09 MED ORDER — PROPOFOL 500 MG/50ML IV EMUL
INTRAVENOUS | Status: AC
  Filled 2019-09-09: qty 50

## 2019-09-09 MED ORDER — PROPOFOL 500 MG/50ML IV EMUL
INTRAVENOUS | Status: DC | PRN
  Administered 2019-09-09: 100 ug/kg/min via INTRAVENOUS

## 2019-09-09 SURGICAL SUPPLY — 55 items
APL PRP STRL LF DISP 70% ISPRP (MISCELLANEOUS) ×2
BLADE CARPAL TUNNEL SNGL USE (BLADE) ×1 IMPLANT
BLADE SURG 15 STRL LF DISP TIS (BLADE) ×4 IMPLANT
BLADE SURG 15 STRL SS (BLADE) ×6
BNDG CMPR 9X4 STRL LF SNTH (GAUZE/BANDAGES/DRESSINGS) ×2
BNDG COHESIVE 3X5 TAN STRL LF (GAUZE/BANDAGES/DRESSINGS) ×1 IMPLANT
BNDG ELASTIC 3X5.8 VLCR STR LF (GAUZE/BANDAGES/DRESSINGS) ×2 IMPLANT
BNDG ESMARK 4X9 LF (GAUZE/BANDAGES/DRESSINGS) ×3 IMPLANT
BNDG GAUZE ELAST 4 BULKY (GAUZE/BANDAGES/DRESSINGS) ×3 IMPLANT
BRUSH SCRUB EZ PLAIN DRY (MISCELLANEOUS) ×2 IMPLANT
CHLORAPREP W/TINT 26 (MISCELLANEOUS) ×3 IMPLANT
CORD BIPOLAR FORCEPS 12FT (ELECTRODE) ×3 IMPLANT
COVER BACK TABLE REUSABLE LG (DRAPES) ×3 IMPLANT
COVER WAND RF STERILE (DRAPES) IMPLANT
CUFF TOURN SGL QUICK 18X4 (TOURNIQUET CUFF) ×2 IMPLANT
DRAIN PENROSE 1/4X12 LTX STRL (WOUND CARE) ×2 IMPLANT
DRAPE EXTREMITY T 121X128X90 (DISPOSABLE) ×3 IMPLANT
DRAPE HALF SHEET 70X43 (DRAPES) ×3 IMPLANT
DRAPE SURG 17X23 STRL (DRAPES) ×3 IMPLANT
DRSG EMULSION OIL 3X3 NADH (GAUZE/BANDAGES/DRESSINGS) ×3 IMPLANT
GAUZE 4X4 16PLY RFD (DISPOSABLE) IMPLANT
GAUZE SPONGE 4X4 12PLY STRL (GAUZE/BANDAGES/DRESSINGS) ×2 IMPLANT
GAUZE XEROFORM 1X8 LF (GAUZE/BANDAGES/DRESSINGS) ×3 IMPLANT
GLOVE BIOGEL PI IND STRL 8 (GLOVE) ×2 IMPLANT
GLOVE BIOGEL PI INDICATOR 8 (GLOVE) ×1
GLOVE SURG SYN 7.5  E (GLOVE) ×2
GLOVE SURG SYN 7.5 E (GLOVE) ×4 IMPLANT
GLOVE SURG SYN 7.5 PF PI (GLOVE) ×1 IMPLANT
GOWN STRL REUS W/ TWL LRG LVL3 (GOWN DISPOSABLE) ×4 IMPLANT
GOWN STRL REUS W/TWL LRG LVL3 (GOWN DISPOSABLE) ×6
LOOP VESSEL MAXI BLUE (MISCELLANEOUS) IMPLANT
MATRIX WOUND MESHED 2X2 (Tissue) ×1 IMPLANT
NDL HYPO 25X1 1.5 SAFETY (NEEDLE) ×2 IMPLANT
NDL SAFETY ECLIPSE 18X1.5 (NEEDLE) ×2 IMPLANT
NEEDLE HYPO 18GX1.5 SHARP (NEEDLE) ×3
NEEDLE HYPO 22GX1.5 SAFETY (NEEDLE) IMPLANT
NEEDLE HYPO 25X1 1.5 SAFETY (NEEDLE) ×3 IMPLANT
NS IRRIG 1000ML POUR BTL (IV SOLUTION) ×3 IMPLANT
PACK BASIN DAY SURGERY FS (CUSTOM PROCEDURE TRAY) ×3 IMPLANT
PAD ALCOHOL SWAB (MISCELLANEOUS) ×12 IMPLANT
PAD CAST 3X4 CTTN HI CHSV (CAST SUPPLIES) ×3 IMPLANT
PADDING CAST ABS 4INX4YD NS (CAST SUPPLIES)
PADDING CAST ABS COTTON 4X4 ST (CAST SUPPLIES) ×1 IMPLANT
PADDING CAST COTTON 3X4 STRL (CAST SUPPLIES) ×3
SPLINT PLASTER CAST XFAST 4X15 (CAST SUPPLIES) ×1 IMPLANT
SPLINT PLASTER XTRA FAST SET 4 (CAST SUPPLIES) ×1
SUT FIBERWIRE 4-0 18 TAPR NDL (SUTURE)
SUT PROLENE 4 0 PS 2 18 (SUTURE) ×3 IMPLANT
SUTURE FIBERWR 4-0 18 TAPR NDL (SUTURE) IMPLANT
SYR BULB 3OZ (MISCELLANEOUS) ×3 IMPLANT
SYR CONTROL 10ML LL (SYRINGE) ×4 IMPLANT
TOWEL GREEN STERILE FF (TOWEL DISPOSABLE) ×3 IMPLANT
TRAY DSU PREP LF (CUSTOM PROCEDURE TRAY) ×3 IMPLANT
UNDERPAD 30X36 HEAVY ABSORB (UNDERPADS AND DIAPERS) ×3 IMPLANT
WOUND MATRIX MESHED 2X2 (Tissue) ×1 IMPLANT

## 2019-09-09 NOTE — Anesthesia Postprocedure Evaluation (Signed)
Anesthesia Post Note  Patient: Matthew Novak  Procedure(s) Performed: Left thumb irrigation and debridement with integra application and surgery as indicated (Left Thumb) INTEGRA GRAFT APPLICATION (Left Thumb)     Patient location during evaluation: PACU Anesthesia Type: MAC Level of consciousness: awake and alert Pain management: pain level controlled Vital Signs Assessment: post-procedure vital signs reviewed and stable Respiratory status: spontaneous breathing, nonlabored ventilation, respiratory function stable and patient connected to nasal cannula oxygen Cardiovascular status: stable and blood pressure returned to baseline Postop Assessment: no apparent nausea or vomiting Anesthetic complications: no    Last Vitals:  Vitals:   09/09/19 1108 09/09/19 1124  BP:  (!) 118/92  Pulse: (!) 49 (!) 58  Resp: 17 20  Temp:  36.6 C  SpO2: 96% 100%    Last Pain:  Vitals:   09/09/19 1124  TempSrc: Oral  PainSc: 0-No pain                 Renny Remer COKER

## 2019-09-09 NOTE — Transfer of Care (Signed)
Immediate Anesthesia Transfer of Care Note  Patient: Matthew Novak  Procedure(s) Performed: Procedure(s) (LRB): Left thumb irrigation and debridement with integra application and surgery as indicated (Left) INTEGRA GRAFT APPLICATION (Left)  Patient Location: PACU  Anesthesia Type: MAC  Level of Consciousness: awake, alert , oriented and patient cooperative  Airway & Oxygen Therapy: Patient Spontanous Breathing and Patient connected to face mask oxygen  Post-op Assessment: Report given to PACU RN and Post -op Vital signs reviewed and stable  Post vital signs: Reviewed and stable  Complications: No apparent anesthesia complications Last Vitals:  Vitals Value Taken Time  BP 105/83 09/09/19 1045  Temp    Pulse 58 09/09/19 1046  Resp 20 09/09/19 1046  SpO2 98 % 09/09/19 1046  Vitals shown include unvalidated device data.  Last Pain:  Vitals:   09/09/19 0837  TempSrc: Oral  PainSc: 6

## 2019-09-09 NOTE — Anesthesia Preprocedure Evaluation (Signed)
Anesthesia Evaluation  Patient identified by MRN, date of birth, ID band Patient awake    Reviewed: Allergy & Precautions, NPO status , Patient's Chart, lab work & pertinent test results  Airway Mallampati: II  TM Distance: >3 FB Neck ROM: Full    Dental  (+) Teeth Intact, Dental Advisory Given   Pulmonary Current Smoker and Patient abstained from smoking.,    breath sounds clear to auscultation       Cardiovascular hypertension,  Rhythm:Regular Rate:Normal     Neuro/Psych    GI/Hepatic   Endo/Other    Renal/GU      Musculoskeletal   Abdominal   Peds  Hematology   Anesthesia Other Findings   Reproductive/Obstetrics                             Anesthesia Physical Anesthesia Plan  ASA: II  Anesthesia Plan: MAC   Post-op Pain Management:    Induction: Intravenous  PONV Risk Score and Plan: Ondansetron  Airway Management Planned: Natural Airway  Additional Equipment:   Intra-op Plan:   Post-operative Plan:   Informed Consent: I have reviewed the patients History and Physical, chart, labs and discussed the procedure including the risks, benefits and alternatives for the proposed anesthesia with the patient or authorized representative who has indicated his/her understanding and acceptance.       Plan Discussed with: CRNA and Anesthesiologist  Anesthesia Plan Comments:         Anesthesia Quick Evaluation

## 2019-09-09 NOTE — Discharge Instructions (Signed)
°  Post Anesthesia Home Care Instructions  Activity: Get plenty of rest for the remainder of the day. A responsible individual must stay with you for 24 hours following the procedure.  For the next 24 hours, DO NOT: -Drive a car -Paediatric nurse -Drink alcoholic beverages -Take any medication unless instructed by your physician -Make any legal decisions or sign important papers.  Meals: Start with liquid foods such as gelatin or soup. Progress to regular foods as tolerated. Avoid greasy, spicy, heavy foods. If nausea and/or vomiting occur, drink only clear liquids until the nausea and/or vomiting subsides. Call your physician if vomiting continues.  Special Instructions/Symptoms: Your throat may feel dry or sore from the anesthesia or the breathing tube placed in your throat during surgery. If this causes discomfort, gargle with warm salt water. The discomfort should disappear within 24 hours.  If you had a scopolamine patch placed behind your ear for the management of post- operative nausea and/or vomiting:  1. The medication in the patch is effective for 72 hours, after which it should be removed.  Wrap patch in a tissue and discard in the trash. Wash hands thoroughly with soap and water. 2. You may remove the patch earlier than 72 hours if you experience unpleasant side effects which may include dry mouth, dizziness or visual disturbances. 3. Avoid touching the patch. Wash your hands with soap and water after contact with the patch.      Discharge Instructions  - Keep dressings in place. Do not remove them. - The dressings must stay dry - Take all medication as prescribed. Transition to over the counter pain medication as your pain improves - Keep the hand elevated over the next 48-72 hours to help with pain and swelling - Move all digits not restricted by the dressings regularly to prevent stiffness - Please call to schedule a follow up appointment with Dr. Jeannie Fend and therapy  at [phone] for 2 weeks following surgery - Your pain medication have been send digitally to your pharmacy     Post Anesthesia Home Care Instructions  Activity: Get plenty of rest for the remainder of the day. A responsible individual must stay with you for 24 hours following the procedure.  For the next 24 hours, DO NOT: -Drive a car -Paediatric nurse -Drink alcoholic beverages -Take any medication unless instructed by your physician -Make any legal decisions or sign important papers.  Meals: Start with liquid foods such as gelatin or soup. Progress to regular foods as tolerated. Avoid greasy, spicy, heavy foods. If nausea and/or vomiting occur, drink only clear liquids until the nausea and/or vomiting subsides. Call your physician if vomiting continues.  Special Instructions/Symptoms: Your throat may feel dry or sore from the anesthesia or the breathing tube placed in your throat during surgery. If this causes discomfort, gargle with warm salt water. The discomfort should disappear within 24 hours.  If you had a scopolamine patch placed behind your ear for the management of post- operative nausea and/or vomiting:  1. The medication in the patch is effective for 72 hours, after which it should be removed.  Wrap patch in a tissue and discard in the trash. Wash hands thoroughly with soap and water. 2. You may remove the patch earlier than 72 hours if you experience unpleasant side effects which may include dry mouth, dizziness or visual disturbances. 3. Avoid touching the patch. Wash your hands with soap and water after contact with the patch.

## 2019-09-09 NOTE — H&P (Signed)
ORTHOPAEDIC H&P  PCP:  Elby Showers, MD  Chief Complaint: Left thumb saw injury  HPI: Matthew Novak is a 43 y.o. male who complains of left thumb saw injury.  Last Thursday he was using a band saw when he amputated the tip of the left thumb.  He was seen in the ER where he had his wounds dressed.  He was subsequently then seen in the clinic where he had healthy tissue in his wound.  We discussed treatment options and he did wish to proceed forward with irrigation of his wound as well as possible skin graft versus allograft placement on the tip of the thumb.  He presents today for that.  Past Medical History:  Diagnosis Date  . Anxiety   . Arthritis   . Depression   . Hyperlipidemia   . Hypertension    stop drinking ETOH- no longer needs meds or has high BP  . Hypothyroidism    Past Surgical History:  Procedure Laterality Date  . CARPAL TUNNEL WITH CUBITAL TUNNEL Right   . CERVICAL DISC ARTHROPLASTY    . FEMORAL ARTERY EXPLORATION Right 05/02/2016   Procedure: RIGHT GROIN EVACUATION HEMATOMA WITH BYPASS OF RIGHT SUPERFICIAL FEMORAL ARTERY USING SAPHENOUS VEIN;  Surgeon: Rosetta Posner, MD;  Location: [facility];  Service: Vascular;  Laterality: Right;  . MOUTH SURGERY    . ulnar nerve Right    Social History   Socioeconomic History  . Marital status: Married    Spouse name: Not on file  . Number of children: Not on file  . Years of education: Not on file  . Highest education level: Not on file  Occupational History  . Not on file  Social Needs  . Financial resource strain: Not on file  . Food insecurity    Worry: Not on file    Inability: Not on file  . Transportation needs    Medical: Not on file    Non-medical: Not on file  Tobacco Use  . Smoking status: Current Every Day Smoker    Packs/day: 0.50    Years: 26.00    Pack years: 13.00    Types: Cigarettes  . Smokeless tobacco: Never Used  Substance and Sexual Activity  . Alcohol use: Not Currently   Alcohol/week: 0.0 standard drinks    Comment: 1 year sober  . Drug use: Yes    Types: Marijuana  . Sexual activity: Yes  Lifestyle  . Physical activity    Days per week: Not on file    Minutes per session: Not on file  . Stress: Not on file  Relationships  . Social Herbalist on phone: Not on file    Gets together: Not on file    Attends religious service: Not on file    Active member of club or organization: Not on file    Attends meetings of clubs or organizations: Not on file    Relationship status: Not on file  Other Topics Concern  . Not on file  Social History Narrative  . Not on file   Family History  Problem Relation Age of Onset  . Hypertension Mother   . Heart disease Father   . Hypertension Father   . Hypertension Brother    Allergies  Allergen Reactions  . Contrast Media [Iodinated Diagnostic Agents] Hives   Prior to Admission medications   Medication Sig Start Date End Date Taking? Authorizing Provider  escitalopram (LEXAPRO) 20 MG tablet Take  1 tablet (20 mg total) by mouth 2 (two) times daily. 12/13/18  Yes Baxley, Cresenciano Lick, MD  HYDROcodone-acetaminophen (NORCO/VICODIN) 5-325 MG tablet Take 2 tablets by mouth every 4 (four) hours as needed. 09/05/19  Yes Nuala Alpha A, PA-C  levothyroxine (SYNTHROID) 50 MCG tablet TAKE 1 TABLET BY MOUTH EVERY DAY 04/02/19  Yes Baxley, Cresenciano Lick, MD  QUEtiapine (SEROQUEL) 50 MG tablet Take 1 tablet (50 mg total) by mouth at bedtime. 07/08/19  Yes Baxley, Cresenciano Lick, MD  sulfaSALAzine (AZULFIDINE) 500 MG tablet Take 500 mg by mouth 4 (four) times daily.   Yes [provider]   No results found.  Positive ROS: All other systems have been reviewed and were otherwise negative with the exception of those mentioned in the HPI and as above.  Physical Exam: General: Alert, no acute distress Cardiovascular: No pedal edema Respiratory: No cyanosis, no use of accessory musculature Skin: No lesions in the area of chief  complaint Psychiatric: Patient is competent for consent with normal mood and affect Lymphatic: No axillary or cervical lymphadenopathy  MUSCULOSKELETAL: Examination of the left thumb shows a laceration through the ulnar aspect of the tip of the thumb. I involves the pulp and tip of the digit as well as a small portion of the nail plate. There is no active bleeding in the wound. The radial aspect of the digit is intact. There is no gross contamination with the wound. There is no erythema, lymphadenopathy or signs of infection. He has tenderness to palpation about the wound. He has intact flexion and extension to the IP and MP joints. His fingertips elsewhere are all warm well perfused with brisk capillary refill. His sensation is intact to light touch at all areas except for the injured portion.  Assessment: Left thumb saw injury with partial amputation  Plan: Plan to proceed to the OR today for irrigation debridement of his left thumb with possible skin grafting versus allograft placement.  Risks of surgery were once again discussed with patient which include but are not limited to infection, bleeding, damage to surrounding structures including blood vessels and nerves, pain, stiffness and need for additional procedures.  Informed consent was obtained.  We will plan on discharge home after with follow-up with me in approximately 10 to 14 days.    Verner Mould, MD [phone]   09/09/2019 9:46 AM

## 2019-09-09 NOTE — Op Note (Signed)
PREOPERATIVE DIAGNOSIS: Left thumb partial amputation  POSTOPERATIVE DIAGNOSIS: Same  ATTENDING PHYSICIAN: Maudry Mayhew. Jeannie Fend, III, MD who was present and scrubbed for the entire case   ASSISTANT SURGEON: None.   ANESTHESIA: MAC with digital block  SURGICAL PROCEDURES:  1.  Left thumb irrigation and debridement of wound approximately 3 cm x 1.5 cm including skin, subcutaneous tissue 2.  Integra placement on left thumb measuring approximately 3 x 1.5 cm  SURGICAL INDICATIONS: Patient is a 43 year old male who was seen and evaluated by me in clinic.  He had a band saw injury to the ulnar aspect of the tip of the thumb.  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  He had clean wound along the tip and ulnar aspect of the thumb and after discussing treatment options the patient did wish to proceed forward with irrigation debridement of his thumb as well as attempted salvage of the thumb tip utilizing Integra and presents today for that.  FINDING: There is a healthy appearing wound at the tip of the thumb along its ulnar aspect.  This was irrigated debridement and then successful placement of Integra was performed covering the skin defect.  DESCRIPTION OF PROCEDURE: The patient was identified in the preoperative holding area where the risk benefits and alternatives of the procedure were discussed with the patient.  These include but are not limited to infection, bleeding, damage to surrounding structures including blood vessels and nerves, pain, stiffness, need for additional procedures.  Informed consent was obtained at that time patient's left thumb was marked with a surgical marking pen.  He was then brought back to the operative suite where a timeout was performed identifying the correct patient operative site.  He was positioned supine on the operative table with his hand outstretched on a hand table.  He was induced under MAC sedation.  A combination of 1% lidocaine and half percent  bupivacaine were then injected into the base of the thumb.  This provided a full digital block throughout the procedure.  The left upper extremity was then prepped and draped in usual sterile fashion.  A Penrose drain was placed around the base of the thumb to act as a digital tourniquet.  A Valora Corporal was then passed about the remaining nail plate which was removed.  This exposed the intact portion of the nail bed.  Gentle debridement of the wound and subsequent nailbed was then performed utilizing a rondure and curette.  This included skin and subcutaneous tissue.  There was no exposed or visualized bone within the wound.  The wound measured to be approximately 3 cm x 1-1/2 cm.  Following debridement the wound was copiously irrigated with normal saline.  There was healthy granulation tissue in the base of the wound at this point.  An Integra meshed bilayer allograft was then placed onto the wound and cut to the appropriate size.  It was sutured circumferentially using interrupted 4-0 Prolene sutures.  There was excess dermal matrix from the uncut portion which was placed underneath the Integra patch to increase the bulk of the tip of the thumb.  Once it was secured in place Adaptic was placed on the nailbed and a piece of Adaptic was placed over top of the Integra.  A bolster 4 x 4 was placed over directly over top of the Integra followed by 4 x 4's, Kerlix and a thumb splint.  The tourniquet was released prior to dressing application and there was return of brisk capillary refill to  the remaining portions of the digit.  The patient was awoken from his sedation and taken to the PACU in stable condition.  He tolerated the procedure well there were no complications  ESTIMATED BLOOD LOSS: Less than 10 mL  TOURNIQUET TIME: Less than 30 minutes  SPECIMENS: None  POSTOPERATIVE PLAN: The patient will be discharged home and seen back  in the office in approximately 2 weeks for removal of the silicone layer on the  Integra as well as suture removal.  We will then have him work with therapy on wound care and continue to monitor for full wound closure.  IMPLANTS: Integra meshed bilayer

## 2019-09-10 ENCOUNTER — Encounter (HOSPITAL_BASED_OUTPATIENT_CLINIC_OR_DEPARTMENT_OTHER): Payer: Self-pay | Admitting: Orthopaedic Surgery

## 2019-09-24 DIAGNOSIS — M79645 Pain in left finger(s): Secondary | ICD-10-CM | POA: Diagnosis not present

## 2019-09-25 DIAGNOSIS — M542 Cervicalgia: Secondary | ICD-10-CM | POA: Diagnosis not present

## 2019-09-25 DIAGNOSIS — M5412 Radiculopathy, cervical region: Secondary | ICD-10-CM | POA: Diagnosis not present

## 2019-09-27 DIAGNOSIS — M79645 Pain in left finger(s): Secondary | ICD-10-CM | POA: Diagnosis not present

## 2019-10-15 DIAGNOSIS — M542 Cervicalgia: Secondary | ICD-10-CM | POA: Diagnosis not present

## 2019-10-15 DIAGNOSIS — M5412 Radiculopathy, cervical region: Secondary | ICD-10-CM | POA: Diagnosis not present

## 2019-10-16 DIAGNOSIS — M5412 Radiculopathy, cervical region: Secondary | ICD-10-CM | POA: Diagnosis not present

## 2019-10-16 DIAGNOSIS — M542 Cervicalgia: Secondary | ICD-10-CM | POA: Diagnosis not present

## 2019-10-17 DIAGNOSIS — M453 Ankylosing spondylitis of cervicothoracic region: Secondary | ICD-10-CM | POA: Diagnosis not present

## 2019-10-17 DIAGNOSIS — Z1589 Genetic susceptibility to other disease: Secondary | ICD-10-CM | POA: Diagnosis not present

## 2019-10-17 DIAGNOSIS — M5489 Other dorsalgia: Secondary | ICD-10-CM | POA: Diagnosis not present

## 2019-10-17 DIAGNOSIS — Z79899 Other long term (current) drug therapy: Secondary | ICD-10-CM | POA: Diagnosis not present

## 2019-10-17 DIAGNOSIS — M79641 Pain in right hand: Secondary | ICD-10-CM | POA: Diagnosis not present

## 2019-11-04 ENCOUNTER — Telehealth: Payer: Self-pay | Admitting: Internal Medicine

## 2019-11-04 MED ORDER — ESCITALOPRAM OXALATE 20 MG PO TABS: 20.0000 mg | ORAL_TABLET | Freq: Two times a day (BID) | ORAL | 5 refills | Status: DC

## 2019-11-04 NOTE — Telephone Encounter (Signed)
Received Fax RX request from  [facility] [phone] [address], [place] Phone:  [phone]  Fax:  [phone]       Medication - escitalopram (LEXAPRO) 20 MG tablet    Last Refill - 09/30/19  Last OV - 07/08/19  Last CPE - 07/08/19  Next Appointment -

## 2019-11-20 DIAGNOSIS — M7918 Myalgia, other site: Secondary | ICD-10-CM | POA: Diagnosis not present

## 2019-11-20 DIAGNOSIS — M542 Cervicalgia: Secondary | ICD-10-CM | POA: Diagnosis not present

## 2019-12-11 DIAGNOSIS — M546 Pain in thoracic spine: Secondary | ICD-10-CM | POA: Diagnosis not present

## 2019-12-11 DIAGNOSIS — M545 Low back pain: Secondary | ICD-10-CM | POA: Diagnosis not present

## 2019-12-11 DIAGNOSIS — M542 Cervicalgia: Secondary | ICD-10-CM | POA: Diagnosis not present

## 2019-12-18 DIAGNOSIS — M542 Cervicalgia: Secondary | ICD-10-CM | POA: Diagnosis not present

## 2019-12-18 DIAGNOSIS — M546 Pain in thoracic spine: Secondary | ICD-10-CM | POA: Diagnosis not present

## 2019-12-18 DIAGNOSIS — M545 Low back pain: Secondary | ICD-10-CM | POA: Diagnosis not present

## 2019-12-23 DIAGNOSIS — M546 Pain in thoracic spine: Secondary | ICD-10-CM | POA: Diagnosis not present

## 2019-12-23 DIAGNOSIS — M542 Cervicalgia: Secondary | ICD-10-CM | POA: Diagnosis not present

## 2019-12-23 DIAGNOSIS — M545 Low back pain: Secondary | ICD-10-CM | POA: Diagnosis not present

## 2020-01-03 DIAGNOSIS — M546 Pain in thoracic spine: Secondary | ICD-10-CM | POA: Diagnosis not present

## 2020-01-03 DIAGNOSIS — M7918 Myalgia, other site: Secondary | ICD-10-CM | POA: Diagnosis not present

## 2020-01-03 DIAGNOSIS — M069 Rheumatoid arthritis, unspecified: Secondary | ICD-10-CM | POA: Diagnosis not present

## 2020-01-03 DIAGNOSIS — M545 Low back pain: Secondary | ICD-10-CM | POA: Diagnosis not present

## 2020-01-03 DIAGNOSIS — R030 Elevated blood-pressure reading, without diagnosis of hypertension: Secondary | ICD-10-CM | POA: Diagnosis not present

## 2020-01-03 DIAGNOSIS — M542 Cervicalgia: Secondary | ICD-10-CM | POA: Diagnosis not present

## 2020-01-08 DIAGNOSIS — M545 Low back pain: Secondary | ICD-10-CM | POA: Diagnosis not present

## 2020-01-08 DIAGNOSIS — M542 Cervicalgia: Secondary | ICD-10-CM | POA: Diagnosis not present

## 2020-01-08 DIAGNOSIS — M546 Pain in thoracic spine: Secondary | ICD-10-CM | POA: Diagnosis not present

## 2020-01-13 DIAGNOSIS — M542 Cervicalgia: Secondary | ICD-10-CM | POA: Diagnosis not present

## 2020-01-13 DIAGNOSIS — M545 Low back pain: Secondary | ICD-10-CM | POA: Diagnosis not present

## 2020-01-13 DIAGNOSIS — M546 Pain in thoracic spine: Secondary | ICD-10-CM | POA: Diagnosis not present

## 2020-01-14 DIAGNOSIS — S68022D Partial traumatic metacarpophalangeal amputation of left thumb, subsequent encounter: Secondary | ICD-10-CM | POA: Diagnosis not present

## 2020-01-20 DIAGNOSIS — M546 Pain in thoracic spine: Secondary | ICD-10-CM | POA: Diagnosis not present

## 2020-01-20 DIAGNOSIS — M545 Low back pain: Secondary | ICD-10-CM | POA: Diagnosis not present

## 2020-01-20 DIAGNOSIS — M542 Cervicalgia: Secondary | ICD-10-CM | POA: Diagnosis not present

## 2020-01-27 ENCOUNTER — Telehealth: Payer: Self-pay | Admitting: Internal Medicine

## 2020-01-27 DIAGNOSIS — M542 Cervicalgia: Secondary | ICD-10-CM | POA: Diagnosis not present

## 2020-01-27 DIAGNOSIS — M545 Low back pain: Secondary | ICD-10-CM | POA: Diagnosis not present

## 2020-01-27 DIAGNOSIS — M546 Pain in thoracic spine: Secondary | ICD-10-CM | POA: Diagnosis not present

## 2020-01-27 MED ORDER — LEVOTHYROXINE SODIUM 50 MCG PO TABS: 50.0000 ug | ORAL_TABLET | Freq: Every day | ORAL | 1 refills | Status: DC

## 2020-01-27 NOTE — Telephone Encounter (Signed)
Received Fax RX request from  [facility] [phone] [address], [place] Phone:  [phone]  Fax:  [phone]       Medication - levothyroxine (SYNTHROID) 50 MCG tablet   Last Refill - 10/22/19  Last OV - 07/08/19  Last CPE - 07/08/19  Next Appointment -

## 2020-02-03 DIAGNOSIS — M546 Pain in thoracic spine: Secondary | ICD-10-CM | POA: Diagnosis not present

## 2020-02-03 DIAGNOSIS — M542 Cervicalgia: Secondary | ICD-10-CM | POA: Diagnosis not present

## 2020-02-03 DIAGNOSIS — M545 Low back pain: Secondary | ICD-10-CM | POA: Diagnosis not present

## 2020-02-10 DIAGNOSIS — M542 Cervicalgia: Secondary | ICD-10-CM | POA: Diagnosis not present

## 2020-02-10 DIAGNOSIS — M546 Pain in thoracic spine: Secondary | ICD-10-CM | POA: Diagnosis not present

## 2020-02-10 DIAGNOSIS — M545 Low back pain: Secondary | ICD-10-CM | POA: Diagnosis not present

## 2020-02-17 DIAGNOSIS — M546 Pain in thoracic spine: Secondary | ICD-10-CM | POA: Diagnosis not present

## 2020-02-17 DIAGNOSIS — M542 Cervicalgia: Secondary | ICD-10-CM | POA: Diagnosis not present

## 2020-02-17 DIAGNOSIS — M545 Low back pain: Secondary | ICD-10-CM | POA: Diagnosis not present

## 2020-02-19 ENCOUNTER — Telehealth: Payer: Self-pay | Admitting: Internal Medicine

## 2020-02-19 NOTE — Telephone Encounter (Signed)
Due for CPE August. Please book before refilling

## 2020-02-19 NOTE — Telephone Encounter (Signed)
Received Fax RX request from  [facility] [phone] [address], [place] Phone:  [phone]  Fax:  [phone]       Medication - QUEtiapine (SEROQUEL) 50 MG tablet   Last Refill - 11/20/19  Last OV - 06/19/20  Last CPE - 06/19/20  Next Appointment -

## 2020-02-20 MED ORDER — QUETIAPINE FUMARATE 50 MG PO TABS: 50.0000 mg | ORAL_TABLET | Freq: Every day | ORAL | 1 refills | Status: DC

## 2020-02-20 NOTE — Telephone Encounter (Signed)
CPE scheduled  

## 2020-02-24 DIAGNOSIS — M545 Low back pain: Secondary | ICD-10-CM | POA: Diagnosis not present

## 2020-02-24 DIAGNOSIS — M542 Cervicalgia: Secondary | ICD-10-CM | POA: Diagnosis not present

## 2020-02-24 DIAGNOSIS — M546 Pain in thoracic spine: Secondary | ICD-10-CM | POA: Diagnosis not present

## 2020-02-27 DIAGNOSIS — M5020 Other cervical disc displacement, unspecified cervical region: Secondary | ICD-10-CM | POA: Diagnosis not present

## 2020-02-27 DIAGNOSIS — M542 Cervicalgia: Secondary | ICD-10-CM | POA: Diagnosis not present

## 2020-02-27 DIAGNOSIS — M069 Rheumatoid arthritis, unspecified: Secondary | ICD-10-CM | POA: Diagnosis not present

## 2020-02-27 DIAGNOSIS — R030 Elevated blood-pressure reading, without diagnosis of hypertension: Secondary | ICD-10-CM | POA: Diagnosis not present

## 2020-03-09 DIAGNOSIS — M542 Cervicalgia: Secondary | ICD-10-CM | POA: Diagnosis not present

## 2020-03-09 DIAGNOSIS — M546 Pain in thoracic spine: Secondary | ICD-10-CM | POA: Diagnosis not present

## 2020-03-09 DIAGNOSIS — M545 Low back pain: Secondary | ICD-10-CM | POA: Diagnosis not present

## 2020-03-17 DIAGNOSIS — M542 Cervicalgia: Secondary | ICD-10-CM | POA: Diagnosis not present

## 2020-03-17 DIAGNOSIS — M545 Low back pain: Secondary | ICD-10-CM | POA: Diagnosis not present

## 2020-03-17 DIAGNOSIS — M546 Pain in thoracic spine: Secondary | ICD-10-CM | POA: Diagnosis not present

## 2020-03-23 DIAGNOSIS — M542 Cervicalgia: Secondary | ICD-10-CM | POA: Diagnosis not present

## 2020-03-23 DIAGNOSIS — M545 Low back pain: Secondary | ICD-10-CM | POA: Diagnosis not present

## 2020-03-23 DIAGNOSIS — M546 Pain in thoracic spine: Secondary | ICD-10-CM | POA: Diagnosis not present

## 2020-03-30 DIAGNOSIS — M545 Low back pain: Secondary | ICD-10-CM | POA: Diagnosis not present

## 2020-03-30 DIAGNOSIS — M542 Cervicalgia: Secondary | ICD-10-CM | POA: Diagnosis not present

## 2020-03-30 DIAGNOSIS — M546 Pain in thoracic spine: Secondary | ICD-10-CM | POA: Diagnosis not present

## 2020-04-07 DIAGNOSIS — M546 Pain in thoracic spine: Secondary | ICD-10-CM | POA: Diagnosis not present

## 2020-04-07 DIAGNOSIS — M545 Low back pain: Secondary | ICD-10-CM | POA: Diagnosis not present

## 2020-04-07 DIAGNOSIS — M542 Cervicalgia: Secondary | ICD-10-CM | POA: Diagnosis not present

## 2020-05-08 DIAGNOSIS — M545 Low back pain: Secondary | ICD-10-CM | POA: Diagnosis not present

## 2020-05-08 DIAGNOSIS — M546 Pain in thoracic spine: Secondary | ICD-10-CM | POA: Diagnosis not present

## 2020-05-08 DIAGNOSIS — M542 Cervicalgia: Secondary | ICD-10-CM | POA: Diagnosis not present

## 2020-05-13 DIAGNOSIS — M542 Cervicalgia: Secondary | ICD-10-CM | POA: Diagnosis not present

## 2020-05-13 DIAGNOSIS — M545 Low back pain: Secondary | ICD-10-CM | POA: Diagnosis not present

## 2020-05-13 DIAGNOSIS — M546 Pain in thoracic spine: Secondary | ICD-10-CM | POA: Diagnosis not present

## 2020-05-18 DIAGNOSIS — M546 Pain in thoracic spine: Secondary | ICD-10-CM | POA: Diagnosis not present

## 2020-05-18 DIAGNOSIS — M545 Low back pain: Secondary | ICD-10-CM | POA: Diagnosis not present

## 2020-05-18 DIAGNOSIS — M542 Cervicalgia: Secondary | ICD-10-CM | POA: Diagnosis not present

## 2020-05-25 DIAGNOSIS — M545 Low back pain: Secondary | ICD-10-CM | POA: Diagnosis not present

## 2020-05-25 DIAGNOSIS — M546 Pain in thoracic spine: Secondary | ICD-10-CM | POA: Diagnosis not present

## 2020-05-25 DIAGNOSIS — M542 Cervicalgia: Secondary | ICD-10-CM | POA: Diagnosis not present

## 2020-05-28 DIAGNOSIS — M453 Ankylosing spondylitis of cervicothoracic region: Secondary | ICD-10-CM | POA: Diagnosis not present

## 2020-05-28 DIAGNOSIS — M2550 Pain in unspecified joint: Secondary | ICD-10-CM | POA: Diagnosis not present

## 2020-05-28 DIAGNOSIS — M7918 Myalgia, other site: Secondary | ICD-10-CM | POA: Diagnosis not present

## 2020-05-28 DIAGNOSIS — M5489 Other dorsalgia: Secondary | ICD-10-CM | POA: Diagnosis not present

## 2020-05-28 DIAGNOSIS — Z1589 Genetic susceptibility to other disease: Secondary | ICD-10-CM | POA: Diagnosis not present

## 2020-05-28 DIAGNOSIS — M542 Cervicalgia: Secondary | ICD-10-CM | POA: Diagnosis not present

## 2020-06-01 DIAGNOSIS — M546 Pain in thoracic spine: Secondary | ICD-10-CM | POA: Diagnosis not present

## 2020-06-01 DIAGNOSIS — M542 Cervicalgia: Secondary | ICD-10-CM | POA: Diagnosis not present

## 2020-06-01 DIAGNOSIS — M545 Low back pain: Secondary | ICD-10-CM | POA: Diagnosis not present

## 2020-06-12 ENCOUNTER — Telehealth: Payer: Self-pay | Admitting: Internal Medicine

## 2020-06-12 MED ORDER — ESCITALOPRAM OXALATE 20 MG PO TABS: 20.0000 mg | ORAL_TABLET | Freq: Two times a day (BID) | ORAL | 5 refills | Status: DC

## 2020-06-12 NOTE — Telephone Encounter (Signed)
Matthew Novak called to say he needed a refill on below medication and that he pharmacy said he needed to contact his doctor, that we had not responded to thier refill request. I do not see request in system.    Medication - escitalopram (LEXAPRO) 20 MG tablet  Last OV - 07/08/19  Last CPE - 07/08/19  Next Appointment - 07/14/20  [id number] - [address], [address] Phone:  [phone]  Fax:  [phone]

## 2020-06-12 NOTE — Telephone Encounter (Signed)
Refill once 

## 2020-07-14 ENCOUNTER — Ambulatory Visit (INDEPENDENT_AMBULATORY_CARE_PROVIDER_SITE_OTHER): Payer: BC Managed Care – PPO | Admitting: Internal Medicine

## 2020-07-14 ENCOUNTER — Other Ambulatory Visit: Payer: Self-pay

## 2020-07-14 ENCOUNTER — Encounter: Payer: Self-pay | Admitting: Internal Medicine

## 2020-07-14 ENCOUNTER — Telehealth (INDEPENDENT_AMBULATORY_CARE_PROVIDER_SITE_OTHER): Payer: BC Managed Care – PPO | Admitting: Internal Medicine

## 2020-07-14 VITALS — BP 110/80 | HR 69 | Ht 69.0 in | Wt 162.0 lb

## 2020-07-14 DIAGNOSIS — E039 Hypothyroidism, unspecified: Secondary | ICD-10-CM | POA: Diagnosis not present

## 2020-07-14 DIAGNOSIS — E782 Mixed hyperlipidemia: Secondary | ICD-10-CM | POA: Diagnosis not present

## 2020-07-14 DIAGNOSIS — M1280 Other specific arthropathies, not elsewhere classified, unspecified site: Secondary | ICD-10-CM

## 2020-07-14 DIAGNOSIS — F411 Generalized anxiety disorder: Secondary | ICD-10-CM | POA: Diagnosis not present

## 2020-07-14 DIAGNOSIS — E785 Hyperlipidemia, unspecified: Secondary | ICD-10-CM | POA: Diagnosis not present

## 2020-07-14 DIAGNOSIS — Z0000 Encounter for general adult medical examination without abnormal findings: Secondary | ICD-10-CM

## 2020-07-14 DIAGNOSIS — F5101 Primary insomnia: Secondary | ICD-10-CM

## 2020-07-14 DIAGNOSIS — F418 Other specified anxiety disorders: Secondary | ICD-10-CM

## 2020-07-14 DIAGNOSIS — E7800 Pure hypercholesterolemia, unspecified: Secondary | ICD-10-CM

## 2020-07-14 DIAGNOSIS — Z23 Encounter for immunization: Secondary | ICD-10-CM

## 2020-07-14 LAB — POCT URINALYSIS DIPSTICK
Appearance: NEGATIVE
Bilirubin, UA: NEGATIVE
Blood, UA: NEGATIVE
Glucose, UA: NEGATIVE
Ketones, UA: NEGATIVE
Leukocytes, UA: NEGATIVE
Nitrite, UA: NEGATIVE
Odor: NEGATIVE
Protein, UA: NEGATIVE
Spec Grav, UA: 1.015 (ref 1.010–1.025)
Urobilinogen, UA: 0.2 E.U./dL
pH, UA: 6.5 (ref 5.0–8.0)

## 2020-07-14 MED ORDER — LEVOTHYROXINE SODIUM 50 MCG PO TABS: 50.0000 ug | ORAL_TABLET | Freq: Every day | ORAL | 1 refills | Status: DC

## 2020-07-14 MED ORDER — QUETIAPINE FUMARATE 50 MG PO TABS: 50.0000 mg | ORAL_TABLET | Freq: Every day | ORAL | 1 refills | Status: DC

## 2020-07-14 MED ORDER — ESCITALOPRAM OXALATE 20 MG PO TABS: 20.0000 mg | ORAL_TABLET | Freq: Two times a day (BID) | ORAL | 5 refills | Status: DC

## 2020-07-14 NOTE — Telephone Encounter (Signed)
Here for CPE. Have refilled Seroquel, Lexapro and Levothyroxine
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
## 2020-07-15 LAB — COMPLETE METABOLIC PANEL WITH GFR
AG Ratio: 2.1 (calc) (ref 1.0–2.5)
ALT: 21 U/L (ref 9–46)
AST: 32 U/L (ref 10–40)
Albumin: 4.4 g/dL (ref 3.6–5.1)
Alkaline phosphatase (APISO): 83 U/L (ref 36–130)
BUN: 18 mg/dL (ref 7–25)
CO2: 28 mmol/L (ref 20–32)
Calcium: 9.1 mg/dL (ref 8.6–10.3)
Chloride: 103 mmol/L (ref 98–110)
Creat: 0.76 mg/dL (ref 0.60–1.35)
GFR, Est African American: 127 mL/min/{1.73_m2} (ref >=60)
GFR, Est Non African American: 109 mL/min/{1.73_m2} (ref >=60)
Globulin: 2.1 g/dL (calc) (ref 1.9–3.7)
Glucose, Bld: 93 mg/dL (ref 65–99)
Potassium: 4.2 mmol/L (ref 3.5–5.3)
Sodium: 138 mmol/L (ref 135–146)
Total Bilirubin: 0.3 mg/dL (ref 0.2–1.2)
Total Protein: 6.5 g/dL (ref 6.1–8.1)

## 2020-07-15 LAB — CBC WITH DIFFERENTIAL/PLATELET
Absolute Monocytes: 1008 cells/uL — ABNORMAL HIGH (ref 200–950)
Basophils Absolute: 101 cells/uL (ref 0–200)
Basophils Relative: 0.8 %
Eosinophils Absolute: 731 cells/uL — ABNORMAL HIGH (ref 15–500)
Eosinophils Relative: 5.8 %
HCT: 46.4 % (ref 38.5–50.0)
Hemoglobin: 15.8 g/dL (ref 13.2–17.1)
Lymphs Abs: 3188 cells/uL (ref 850–3900)
MCH: 30.6 pg (ref 27.0–33.0)
MCHC: 34.1 g/dL (ref 32.0–36.0)
MCV: 89.9 fL (ref 80.0–100.0)
MPV: 9.1 fL (ref 7.5–12.5)
Monocytes Relative: 8 %
Neutro Abs: 7573 cells/uL (ref 1500–7800)
Neutrophils Relative %: 60.1 %
Platelets: 245 10*3/uL (ref 140–400)
RBC: 5.16 10*6/uL (ref 4.20–5.80)
RDW: 12.3 % (ref 11.0–15.0)
Total Lymphocyte: 25.3 %
WBC: 12.6 10*3/uL — ABNORMAL HIGH (ref 3.8–10.8)

## 2020-07-15 LAB — LIPID PANEL
Cholesterol: 191 mg/dL (ref <200)
HDL: 33 mg/dL — ABNORMAL LOW (ref >=40)
LDL Cholesterol (Calc): 125 mg/dL (calc) — ABNORMAL HIGH
Non-HDL Cholesterol (Calc): 158 mg/dL (calc) — ABNORMAL HIGH (ref <130)
Total CHOL/HDL Ratio: 5.8 (calc) — ABNORMAL HIGH (ref <5.0)
Triglycerides: 209 mg/dL — ABNORMAL HIGH (ref <150)

## 2020-07-15 LAB — TSH: TSH: 1.41 mIU/L (ref 0.40–4.50)

## 2020-07-28 DIAGNOSIS — M542 Cervicalgia: Secondary | ICD-10-CM | POA: Diagnosis not present

## 2020-07-28 DIAGNOSIS — M069 Rheumatoid arthritis, unspecified: Secondary | ICD-10-CM | POA: Diagnosis not present

## 2020-07-28 DIAGNOSIS — M7918 Myalgia, other site: Secondary | ICD-10-CM | POA: Diagnosis not present

## 2020-08-01 NOTE — Patient Instructions (Signed)
It was a pleasure to see you today.  Continue current medications.  Cholesterol and triglycerides are elevated.  Watch diet.  Continue current thyroid medication.  Continue Lexapro and Seroquel.  Flu vaccine given.  Return in 1 year or as needed.

## 2020-08-11 ENCOUNTER — Other Ambulatory Visit: Payer: Self-pay

## 2020-08-11 ENCOUNTER — Other Ambulatory Visit: Payer: BC Managed Care – PPO | Admitting: Internal Medicine

## 2020-08-11 DIAGNOSIS — D72829 Elevated white blood cell count, unspecified: Secondary | ICD-10-CM | POA: Diagnosis not present

## 2020-08-11 LAB — CBC WITH DIFFERENTIAL/PLATELET
Absolute Monocytes: 972 cells/uL — ABNORMAL HIGH (ref 200–950)
Basophils Absolute: 65 cells/uL (ref 0–200)
Basophils Relative: 0.6 %
Eosinophils Absolute: 702 cells/uL — ABNORMAL HIGH (ref 15–500)
Eosinophils Relative: 6.5 %
HCT: 45.6 % (ref 38.5–50.0)
Hemoglobin: 15.7 g/dL (ref 13.2–17.1)
Lymphs Abs: 2884 cells/uL (ref 850–3900)
MCH: 30.8 pg (ref 27.0–33.0)
MCHC: 34.4 g/dL (ref 32.0–36.0)
MCV: 89.6 fL (ref 80.0–100.0)
MPV: 9.4 fL (ref 7.5–12.5)
Monocytes Relative: 9 %
Neutro Abs: 6178 cells/uL (ref 1500–7800)
Neutrophils Relative %: 57.2 %
Platelets: 221 10*3/uL (ref 140–400)
RBC: 5.09 10*6/uL (ref 4.20–5.80)
RDW: 12.3 % (ref 11.0–15.0)
Total Lymphocyte: 26.7 %
WBC: 10.8 10*3/uL (ref 3.8–10.8)

## 2020-08-19 ENCOUNTER — Other Ambulatory Visit: Payer: Self-pay | Admitting: Internal Medicine

## 2020-10-14 DIAGNOSIS — M7918 Myalgia, other site: Secondary | ICD-10-CM | POA: Diagnosis not present

## 2020-10-14 DIAGNOSIS — M5412 Radiculopathy, cervical region: Secondary | ICD-10-CM | POA: Diagnosis not present

## 2020-10-14 DIAGNOSIS — M069 Rheumatoid arthritis, unspecified: Secondary | ICD-10-CM | POA: Diagnosis not present

## 2020-11-30 DIAGNOSIS — M5489 Other dorsalgia: Secondary | ICD-10-CM | POA: Diagnosis not present

## 2020-11-30 DIAGNOSIS — M453 Ankylosing spondylitis of cervicothoracic region: Secondary | ICD-10-CM | POA: Diagnosis not present

## 2020-11-30 DIAGNOSIS — Z1589 Genetic susceptibility to other disease: Secondary | ICD-10-CM | POA: Diagnosis not present

## 2020-11-30 DIAGNOSIS — M2550 Pain in unspecified joint: Secondary | ICD-10-CM | POA: Diagnosis not present

## 2020-12-15 DIAGNOSIS — M069 Rheumatoid arthritis, unspecified: Secondary | ICD-10-CM | POA: Diagnosis not present

## 2020-12-15 DIAGNOSIS — M7918 Myalgia, other site: Secondary | ICD-10-CM | POA: Diagnosis not present

## 2020-12-15 DIAGNOSIS — M546 Pain in thoracic spine: Secondary | ICD-10-CM | POA: Diagnosis not present

## 2020-12-15 DIAGNOSIS — G8929 Other chronic pain: Secondary | ICD-10-CM | POA: Diagnosis not present

## 2021-02-17 ENCOUNTER — Other Ambulatory Visit: Payer: Self-pay | Admitting: Internal Medicine

## 2021-02-17 DIAGNOSIS — M5412 Radiculopathy, cervical region: Secondary | ICD-10-CM | POA: Diagnosis not present

## 2021-02-17 DIAGNOSIS — Z981 Arthrodesis status: Secondary | ICD-10-CM | POA: Diagnosis not present

## 2021-02-17 DIAGNOSIS — M25511 Pain in right shoulder: Secondary | ICD-10-CM | POA: Diagnosis not present

## 2021-02-18 NOTE — Telephone Encounter (Signed)
Need to book CPE for September before refilling

## 2021-02-18 NOTE — Telephone Encounter (Signed)
CPE Scheduled in September.

## 2021-02-24 DIAGNOSIS — M25511 Pain in right shoulder: Secondary | ICD-10-CM | POA: Diagnosis not present

## 2021-03-21 ENCOUNTER — Telehealth: Payer: Self-pay | Admitting: Internal Medicine

## 2021-03-22 NOTE — Telephone Encounter (Signed)
Have received notice from [facility] that certain meds will need to be written differently as of July 1. His Lexapro does not come in a 40 mg tab so he has to take 2x 20 mg tabs. This may require authorization from them beginning July 1st.

## 2021-04-07 ENCOUNTER — Other Ambulatory Visit: Payer: Self-pay | Admitting: Internal Medicine

## 2021-05-31 DIAGNOSIS — M2550 Pain in unspecified joint: Secondary | ICD-10-CM | POA: Diagnosis not present

## 2021-05-31 DIAGNOSIS — Z1589 Genetic susceptibility to other disease: Secondary | ICD-10-CM | POA: Diagnosis not present

## 2021-05-31 DIAGNOSIS — M5489 Other dorsalgia: Secondary | ICD-10-CM | POA: Diagnosis not present

## 2021-05-31 DIAGNOSIS — M453 Ankylosing spondylitis of cervicothoracic region: Secondary | ICD-10-CM | POA: Diagnosis not present

## 2021-06-25 ENCOUNTER — Other Ambulatory Visit: Payer: Self-pay

## 2021-06-25 MED ORDER — ESCITALOPRAM OXALATE 20 MG PO TABS: ORAL_TABLET | ORAL | 5 refills | Status: DC

## 2021-06-25 NOTE — Telephone Encounter (Signed)
Patient's LEXAPRO 1 tab bid was denied by [employer]. Per [employer] his insurance pays for [facility] '20mg'$  once daily.  Patient stated he would like LEXAPRO '20mg'$  one tablet daily sent to his pharmacy.

## 2021-07-13 ENCOUNTER — Other Ambulatory Visit: Payer: BC Managed Care – PPO | Admitting: Internal Medicine

## 2021-07-13 ENCOUNTER — Other Ambulatory Visit: Payer: Self-pay

## 2021-07-13 DIAGNOSIS — E7800 Pure hypercholesterolemia, unspecified: Secondary | ICD-10-CM

## 2021-07-13 DIAGNOSIS — E782 Mixed hyperlipidemia: Secondary | ICD-10-CM

## 2021-07-13 DIAGNOSIS — F5101 Primary insomnia: Secondary | ICD-10-CM

## 2021-07-13 DIAGNOSIS — F411 Generalized anxiety disorder: Secondary | ICD-10-CM

## 2021-07-13 DIAGNOSIS — Z0000 Encounter for general adult medical examination without abnormal findings: Secondary | ICD-10-CM

## 2021-07-13 DIAGNOSIS — E039 Hypothyroidism, unspecified: Secondary | ICD-10-CM

## 2021-07-15 ENCOUNTER — Ambulatory Visit (INDEPENDENT_AMBULATORY_CARE_PROVIDER_SITE_OTHER): Payer: BC Managed Care – PPO | Admitting: Internal Medicine

## 2021-07-15 ENCOUNTER — Encounter: Payer: Self-pay | Admitting: Internal Medicine

## 2021-07-15 ENCOUNTER — Other Ambulatory Visit: Payer: BC Managed Care – PPO | Admitting: Internal Medicine

## 2021-07-15 ENCOUNTER — Other Ambulatory Visit: Payer: Self-pay

## 2021-07-15 VITALS — BP 112/72 | Temp 97.0°F | Ht 69.0 in | Wt 170.0 lb

## 2021-07-15 DIAGNOSIS — E7800 Pure hypercholesterolemia, unspecified: Secondary | ICD-10-CM

## 2021-07-15 DIAGNOSIS — E039 Hypothyroidism, unspecified: Secondary | ICD-10-CM

## 2021-07-15 DIAGNOSIS — F418 Other specified anxiety disorders: Secondary | ICD-10-CM

## 2021-07-15 DIAGNOSIS — Z0000 Encounter for general adult medical examination without abnormal findings: Secondary | ICD-10-CM

## 2021-07-15 DIAGNOSIS — F5101 Primary insomnia: Secondary | ICD-10-CM | POA: Diagnosis not present

## 2021-07-15 DIAGNOSIS — Z23 Encounter for immunization: Secondary | ICD-10-CM | POA: Diagnosis not present

## 2021-07-15 DIAGNOSIS — M1280 Other specific arthropathies, not elsewhere classified, unspecified site: Secondary | ICD-10-CM | POA: Diagnosis not present

## 2021-07-15 DIAGNOSIS — E782 Mixed hyperlipidemia: Secondary | ICD-10-CM

## 2021-07-15 LAB — COMPLETE METABOLIC PANEL WITH GFR
AG Ratio: 2.4 (calc) (ref 1.0–2.5)
ALT: 14 U/L (ref 9–46)
AST: 24 U/L (ref 10–40)
Albumin: 4.6 g/dL (ref 3.6–5.1)
Alkaline phosphatase (APISO): 69 U/L (ref 36–130)
BUN: 11 mg/dL (ref 7–25)
CO2: 31 mmol/L (ref 20–32)
Calcium: 9.5 mg/dL (ref 8.6–10.3)
Chloride: 104 mmol/L (ref 98–110)
Creat: 0.82 mg/dL (ref 0.60–1.29)
Globulin: 1.9 g/dL (calc) (ref 1.9–3.7)
Glucose, Bld: 89 mg/dL (ref 65–99)
Potassium: 4.7 mmol/L (ref 3.5–5.3)
Sodium: 140 mmol/L (ref 135–146)
Total Bilirubin: 0.5 mg/dL (ref 0.2–1.2)
Total Protein: 6.5 g/dL (ref 6.1–8.1)
eGFR: 109 mL/min/{1.73_m2} (ref >=60)

## 2021-07-15 LAB — POCT URINALYSIS DIPSTICK
Bilirubin, UA: NEGATIVE
Blood, UA: NEGATIVE
Glucose, UA: NEGATIVE
Ketones, UA: NEGATIVE
Leukocytes, UA: NEGATIVE
Nitrite, UA: NEGATIVE
Protein, UA: NEGATIVE
Spec Grav, UA: <=1.005 — AB (ref 1.010–1.025)
Urobilinogen, UA: 0.2 E.U./dL
pH, UA: 7 (ref 5.0–8.0)

## 2021-07-15 LAB — CBC WITH DIFFERENTIAL/PLATELET
Absolute Monocytes: 806 cells/uL (ref 200–950)
Basophils Absolute: 71 cells/uL (ref 0–200)
Basophils Relative: 0.9 %
Eosinophils Absolute: 442 cells/uL (ref 15–500)
Eosinophils Relative: 5.6 %
HCT: 46.9 % (ref 38.5–50.0)
Hemoglobin: 15.9 g/dL (ref 13.2–17.1)
Lymphs Abs: 2583 cells/uL (ref 850–3900)
MCH: 31.1 pg (ref 27.0–33.0)
MCHC: 33.9 g/dL (ref 32.0–36.0)
MCV: 91.6 fL (ref 80.0–100.0)
MPV: 10.1 fL (ref 7.5–12.5)
Monocytes Relative: 10.2 %
Neutro Abs: 3997 cells/uL (ref 1500–7800)
Neutrophils Relative %: 50.6 %
Platelets: 249 10*3/uL (ref 140–400)
RBC: 5.12 10*6/uL (ref 4.20–5.80)
RDW: 12.7 % (ref 11.0–15.0)
Total Lymphocyte: 32.7 %
WBC: 7.9 10*3/uL (ref 3.8–10.8)

## 2021-07-15 LAB — LIPID PANEL
Cholesterol: 221 mg/dL — ABNORMAL HIGH (ref <200)
HDL: 30 mg/dL — ABNORMAL LOW (ref >=40)
LDL Cholesterol (Calc): 163 mg/dL (calc) — ABNORMAL HIGH
Non-HDL Cholesterol (Calc): 191 mg/dL (calc) — ABNORMAL HIGH (ref <130)
Total CHOL/HDL Ratio: 7.4 (calc) — ABNORMAL HIGH (ref <5.0)
Triglycerides: 138 mg/dL (ref <150)

## 2021-07-15 LAB — TSH: TSH: 1.46 mIU/L (ref 0.40–4.50)

## 2021-07-15 NOTE — Patient Instructions (Addendum)
Will be visiting counselor on September 20th.  Tetanus immunization update given today.  Patient does not want to be on statin medication.  Continue to watch diet.  Continue current medications and follow-up in 1 year or as needed.  Flu vaccine given.
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
## 2021-07-16 ENCOUNTER — Telehealth: Payer: Self-pay

## 2021-07-16 MED ORDER — ROSUVASTATIN CALCIUM 10 MG PO TABS: 10.0000 mg | ORAL_TABLET | Freq: Every day | ORAL | 1 refills | Status: DC

## 2021-07-16 NOTE — Telephone Encounter (Signed)
-----   Message from Elby Showers, MD sent at 07/16/2021 11:48 AM EDT ----- Please call him. See if he will consider starting Crestor 10 mg daily for elevated cholesterol and RTC in 3 months for OV lipid and liver fuctions ----- Message ----- From: Interface, [facility] Lab Results In Sent: 07/15/2021   3:21 PM EDT To: Elby Showers, MD

## 2021-07-16 NOTE — Telephone Encounter (Signed)
Spoke with patient regarding lab results and recommendations.  Prescription for crestor sent to pharmacy.  Patient is agreeable and appointments scheduled.

## 2021-08-09 DIAGNOSIS — F411 Generalized anxiety disorder: Secondary | ICD-10-CM | POA: Diagnosis not present

## 2021-08-18 ENCOUNTER — Other Ambulatory Visit: Payer: Self-pay | Admitting: Internal Medicine

## 2021-08-25 DIAGNOSIS — F411 Generalized anxiety disorder: Secondary | ICD-10-CM | POA: Diagnosis not present

## 2021-08-26 ENCOUNTER — Other Ambulatory Visit: Payer: Self-pay | Admitting: Internal Medicine

## 2021-09-06 DIAGNOSIS — F411 Generalized anxiety disorder: Secondary | ICD-10-CM | POA: Diagnosis not present

## 2021-09-20 DIAGNOSIS — F411 Generalized anxiety disorder: Secondary | ICD-10-CM | POA: Diagnosis not present

## 2021-09-20 IMAGING — CR DG HAND COMPLETE 3+V*L*
3 series · 3 of 3 positions shown · non-contrast
Comparison: None.

CLINICAL DATA: Partial amputation after injury with saw at work.

EXAM:
LEFT HAND - COMPLETE 3+ VIEW

[hand pa]
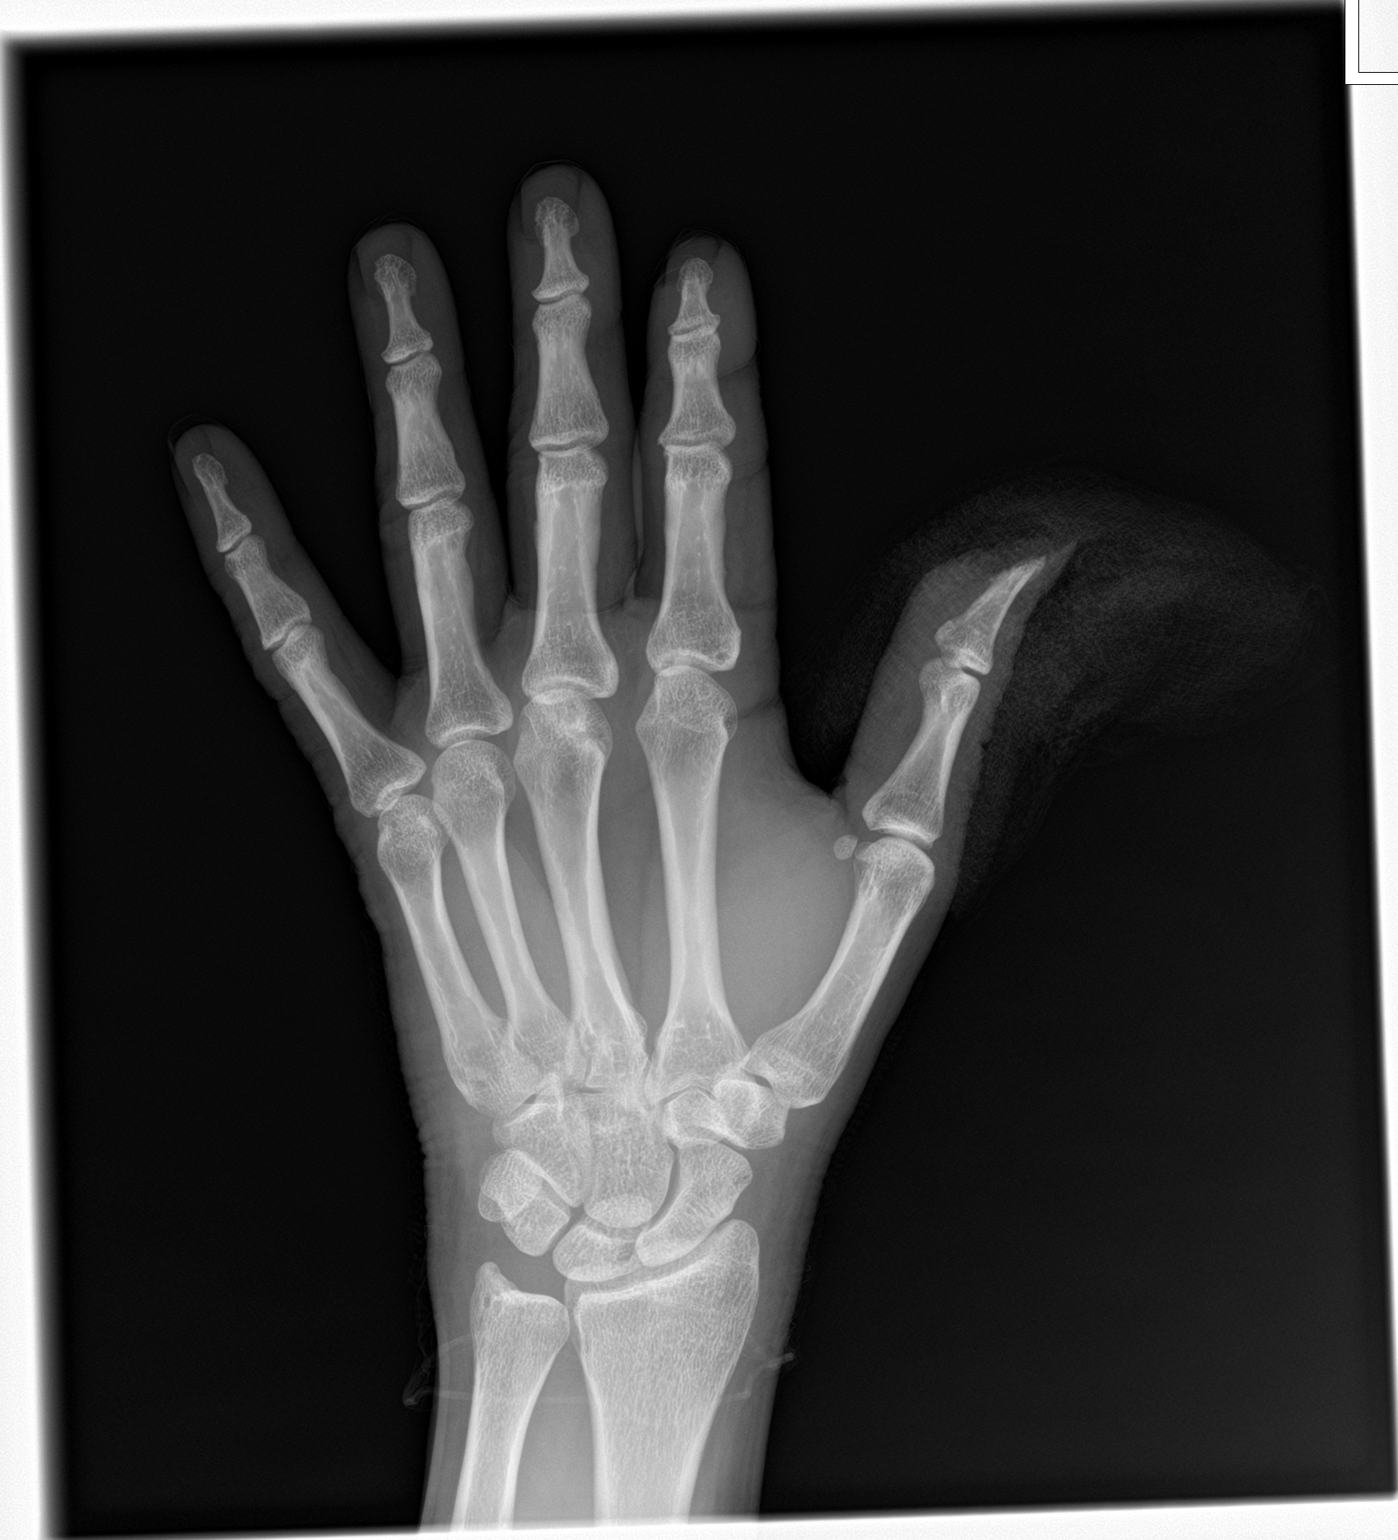

[hand obl]
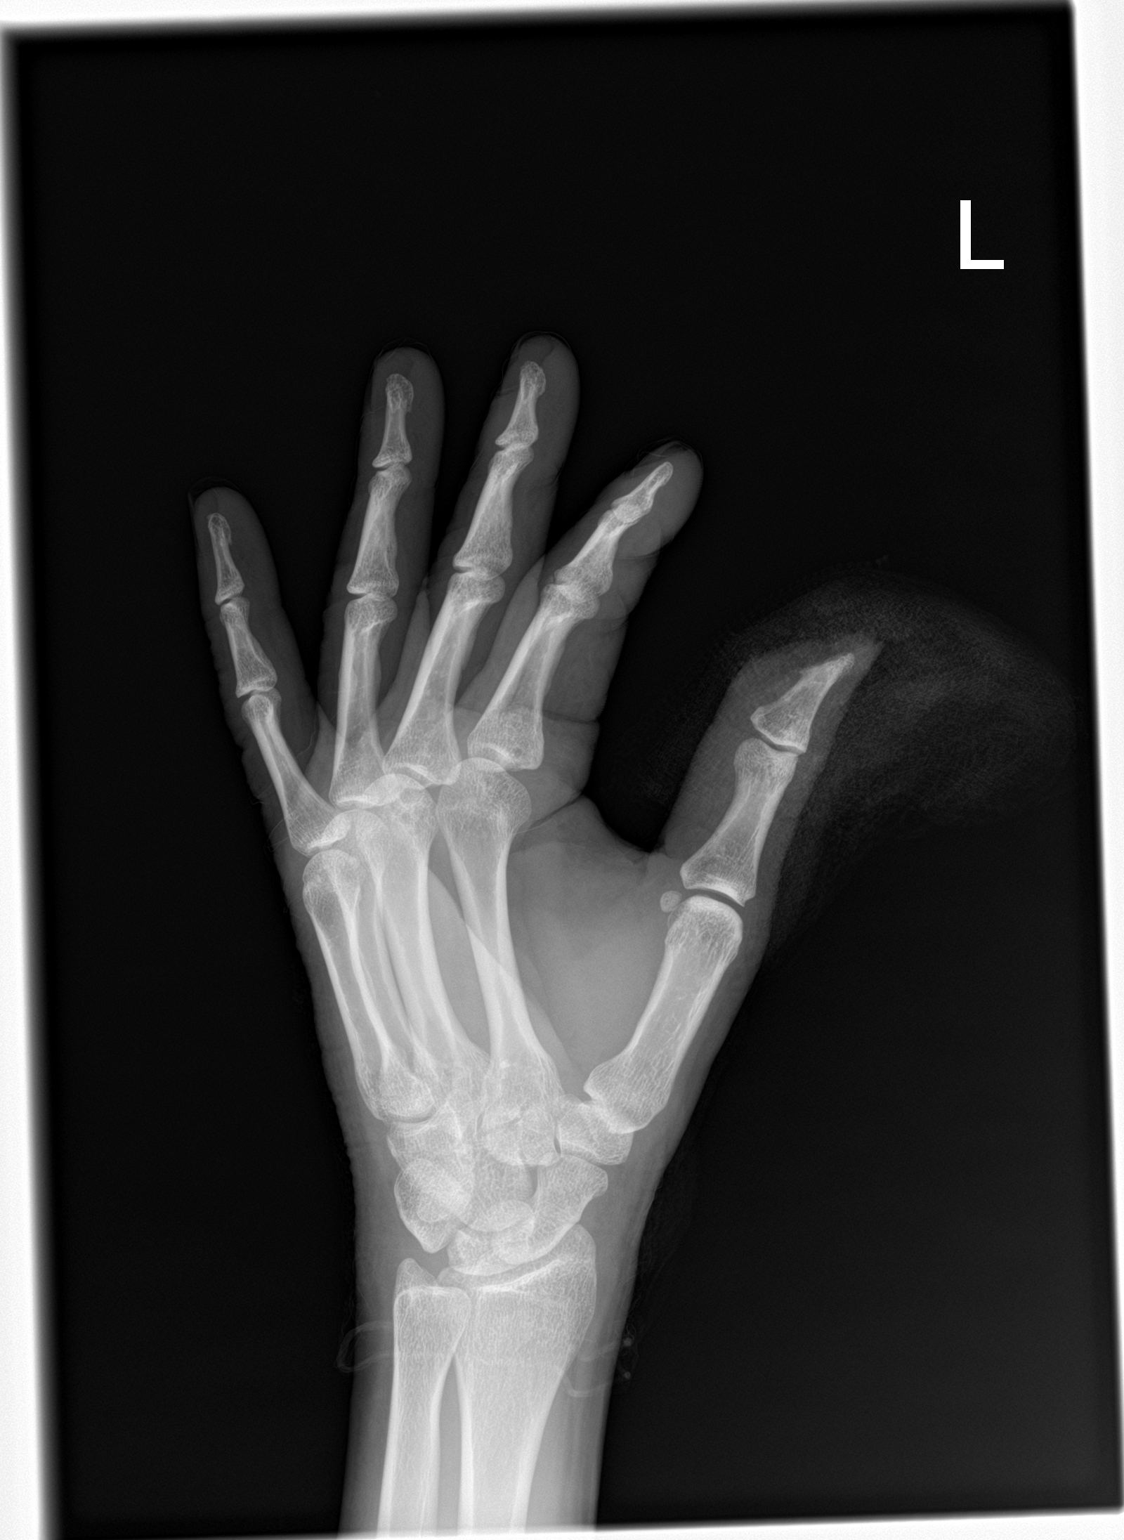

[hand lat]
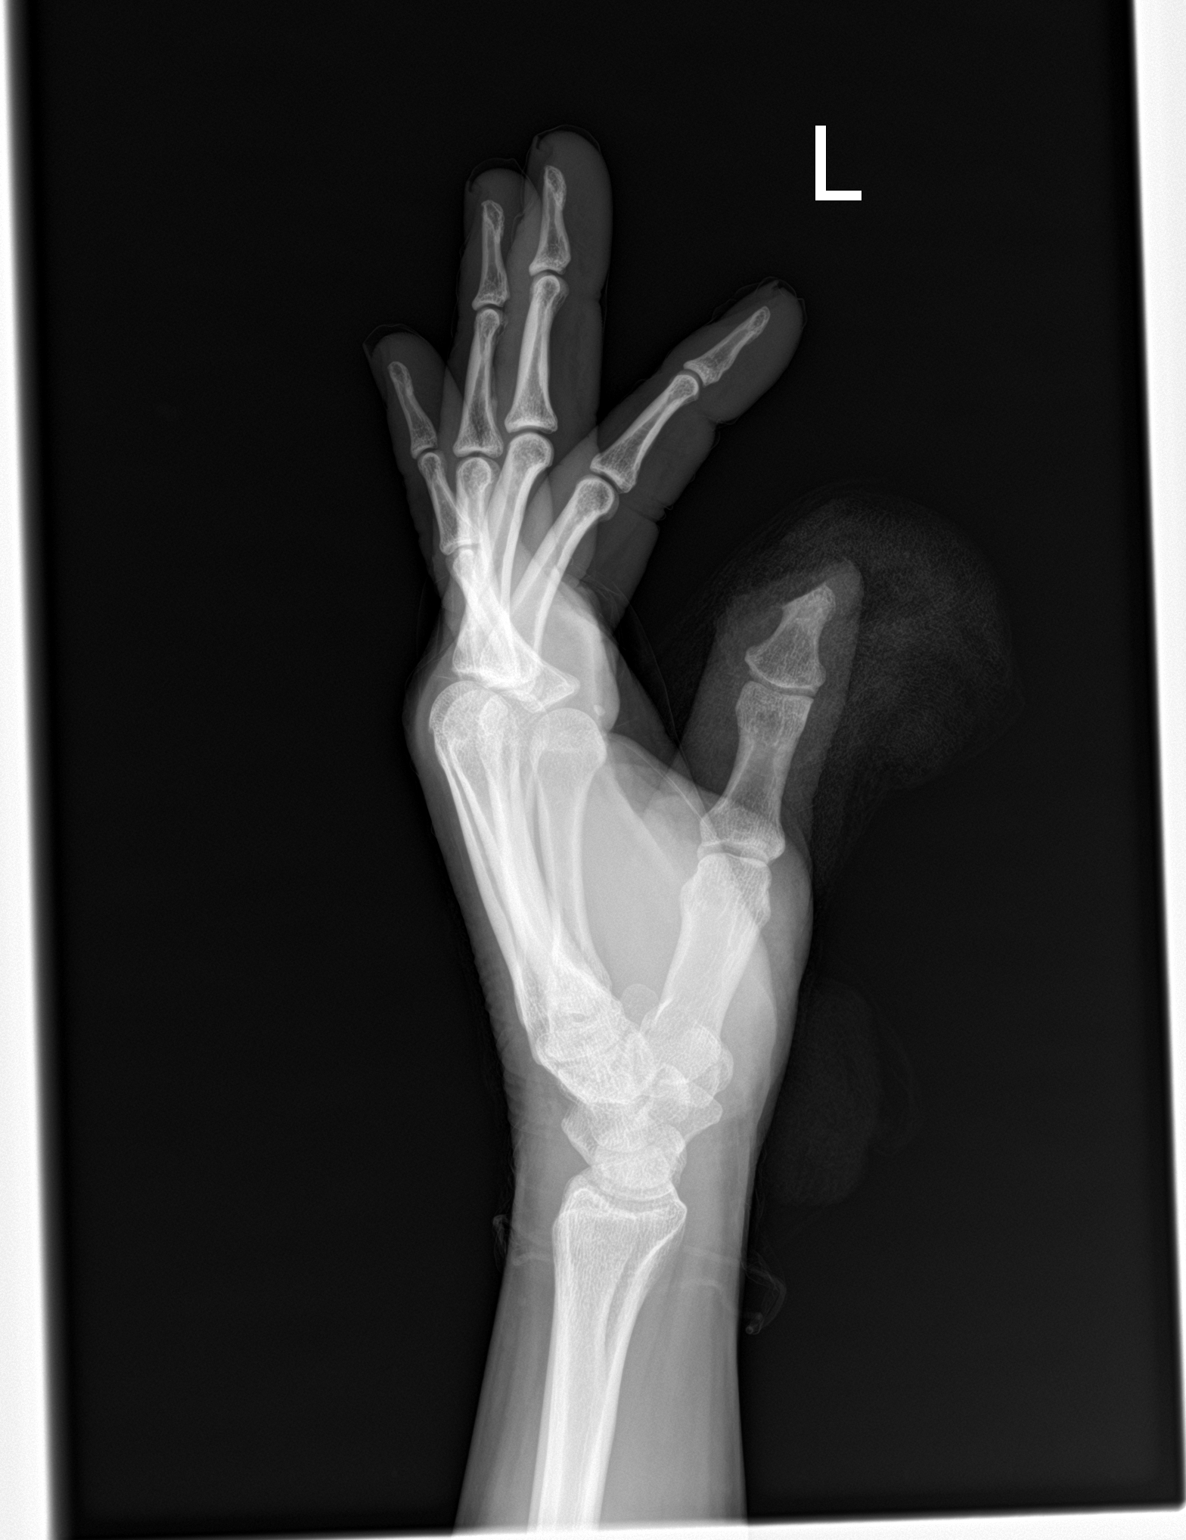

[3 of 3 positions shown; findings below may reference images not displayed]

FINDINGS: Status post traumatic amputation of distal tuft of first distal
phalanx as well as surrounding soft tissues. No radiopaque foreign
body is noted. No other bony abnormality is noted. Joint spaces are
intact.
IMPRESSION: Status post traumatic amputation of distal tuft of first distal
phalanx as well as surrounding soft tissues.

## 2021-10-04 DIAGNOSIS — F411 Generalized anxiety disorder: Secondary | ICD-10-CM | POA: Diagnosis not present

## 2021-10-18 ENCOUNTER — Other Ambulatory Visit: Payer: Self-pay

## 2021-10-18 ENCOUNTER — Other Ambulatory Visit: Payer: BC Managed Care – PPO | Admitting: Internal Medicine

## 2021-10-18 DIAGNOSIS — E7800 Pure hypercholesterolemia, unspecified: Secondary | ICD-10-CM | POA: Diagnosis not present

## 2021-10-18 DIAGNOSIS — F411 Generalized anxiety disorder: Secondary | ICD-10-CM | POA: Diagnosis not present

## 2021-10-18 DIAGNOSIS — F1021 Alcohol dependence, in remission: Secondary | ICD-10-CM

## 2021-10-18 LAB — LIPID PANEL
Cholesterol: 167 mg/dL (ref <200)
HDL: 34 mg/dL — ABNORMAL LOW (ref >=40)
LDL Cholesterol (Calc): 110 mg/dL (calc) — ABNORMAL HIGH
Non-HDL Cholesterol (Calc): 133 mg/dL (calc) — ABNORMAL HIGH (ref <130)
Total CHOL/HDL Ratio: 4.9 (calc) (ref <5.0)
Triglycerides: 118 mg/dL (ref <150)

## 2021-10-18 LAB — HEPATIC FUNCTION PANEL
AG Ratio: 2.1 (calc) (ref 1.0–2.5)
ALT: 20 U/L (ref 9–46)
AST: 28 U/L (ref 10–40)
Albumin: 4.5 g/dL (ref 3.6–5.1)
Alkaline phosphatase (APISO): 78 U/L (ref 36–130)
Bilirubin, Direct: 0.1 mg/dL (ref 0.0–0.2)
Globulin: 2.1 g/dL (calc) (ref 1.9–3.7)
Indirect Bilirubin: 0.5 mg/dL (calc) (ref 0.2–1.2)
Total Bilirubin: 0.6 mg/dL (ref 0.2–1.2)
Total Protein: 6.6 g/dL (ref 6.1–8.1)

## 2021-10-21 ENCOUNTER — Ambulatory Visit: Payer: BC Managed Care – PPO | Admitting: Internal Medicine

## 2021-10-21 ENCOUNTER — Encounter: Payer: Self-pay | Admitting: Internal Medicine

## 2021-10-21 ENCOUNTER — Other Ambulatory Visit: Payer: Self-pay

## 2021-10-21 VITALS — BP 102/78 | HR 62 | Temp 97.7°F | Ht 69.0 in | Wt 168.0 lb

## 2021-10-21 DIAGNOSIS — Z1211 Encounter for screening for malignant neoplasm of colon: Secondary | ICD-10-CM

## 2021-10-21 NOTE — Progress Notes (Signed)
° °  Subjective:    Patient ID: Matthew Novak, male    DOB: 1976-05-28, 45 y.o.   MRN: [id number]  HPI 45 year old Male with history of HLA-B27 spondyloarthropathy affecting spine, right hand, and fingers is being followed by [facility].  He has a history of hypothyroidism, anxiety, hyperlipidemia, insomnia.  Has been treated with Lexapro and Seroquel.  For hypothyroidism he is on levothyroxine 50 mcg daily.  Up until recently, was on Crestor 10 mg daily for hyperlipidemia.  He has decided to discontinue this.  In September, total cholesterol was 221, HDL 30, LDL 163 and triglycerides were 138.  Now total cholesterol is 167, HDL 34, triglycerides 118 and LDL cholesterol 110.  Says he has been able to do this with diet and exercise alone.  He used to take antihypertensive medication but his blood pressure normalized and he no longer takes this.  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  He would like to be referred for screening colonoscopy  Review of Systems no new complaints     Objective:   Physical Exam Blood pressure 102/78, pulse 62, temperature 97.7 degrees pulse oximetry 98% weight 168 pounds BMI 24.81  No thyromegaly.  Chest clear to auscultation.  Cardiac exam: Regular rate and rhythm without ectopy or murmur.  No lower extremity edema.  Affect thought and judgment appear to be normal.       Assessment & Plan:  Hyperlipidemia-currently not taking Crestor.  Lipids have normalized.  He does not want to be on lipid-lowering medication.  Need for colonoscopy-make referral  History of anxiety and depression-stable  Health maintenance-has not had recent COVID booster last vaccines on file or in 2021.  Does want to get flu vaccine.  Plan: Return in September for health maintenance exam and fasting labs.  Flu vaccine given.

## 2021-11-01 NOTE — Patient Instructions (Signed)
Flu vaccine given.  Patient does not want to take statin medication.  His lipids have normalized.  Return in September for health maintenance exam.  Make referral for screening colonoscopy.

## 2021-11-15 DIAGNOSIS — F411 Generalized anxiety disorder: Secondary | ICD-10-CM | POA: Diagnosis not present

## 2021-11-22 ENCOUNTER — Encounter: Payer: Self-pay | Admitting: Gastroenterology

## 2021-11-22 ENCOUNTER — Other Ambulatory Visit: Payer: Self-pay

## 2021-11-22 ENCOUNTER — Ambulatory Visit (AMBULATORY_SURGERY_CENTER): Payer: Self-pay

## 2021-11-22 VITALS — Ht 70.0 in | Wt 170.0 lb

## 2021-11-22 DIAGNOSIS — Z1211 Encounter for screening for malignant neoplasm of colon: Secondary | ICD-10-CM

## 2021-11-22 MED ORDER — NA SULFATE-K SULFATE-MG SULF 17.5-3.13-1.6 GM/177ML PO SOLN: 1.0000 | Freq: Once | ORAL | 0 refills | Status: AC

## 2021-11-22 NOTE — Progress Notes (Signed)
Denies allergies to eggs or soy products. Denies complication of anesthesia or sedation. Denies use of weight loss medication. Denies use of O2.   Emmi instructions given for colonoscopy.  

## 2021-11-29 DIAGNOSIS — F411 Generalized anxiety disorder: Secondary | ICD-10-CM | POA: Diagnosis not present

## 2021-12-01 ENCOUNTER — Telehealth: Payer: Self-pay

## 2021-12-01 DIAGNOSIS — M79642 Pain in left hand: Secondary | ICD-10-CM | POA: Diagnosis not present

## 2021-12-01 DIAGNOSIS — I7300 Raynaud's syndrome without gangrene: Secondary | ICD-10-CM | POA: Diagnosis not present

## 2021-12-01 DIAGNOSIS — M2550 Pain in unspecified joint: Secondary | ICD-10-CM | POA: Diagnosis not present

## 2021-12-01 DIAGNOSIS — Z79899 Other long term (current) drug therapy: Secondary | ICD-10-CM | POA: Diagnosis not present

## 2021-12-01 DIAGNOSIS — M453 Ankylosing spondylitis of cervicothoracic region: Secondary | ICD-10-CM | POA: Diagnosis not present

## 2021-12-01 DIAGNOSIS — M79641 Pain in right hand: Secondary | ICD-10-CM | POA: Diagnosis not present

## 2021-12-01 NOTE — Telephone Encounter (Signed)
-----   Message from Ladene Artist, MD sent at 12/01/2021  8:02 AM EST ----- Regarding: FW: Please reschedule at [facility] as requested. ----- Message ----- From: Osvaldo Angst, CRNA Sent: 12/01/2021   7:32 AM EST To: Ladene Artist, MD  Dr. Fuller Plan,  This pt is scheduled with you on 12/06/21.   This pt is a documented difficult intubation and his procedure will need to be done at the hospital.    Thanks,  Osvaldo Angst

## 2021-12-01 NOTE — Telephone Encounter (Signed)
Informed patient that we have reviewed his chart more thoroughly and found he has difficult intubation. Patient states he is aware of his difficult intubation. Explained to patient he will have to be rescheduled to the hospital. Patient verbalized understanding. Canceled procedure in the [facility]. Informed patient will contact him when we have an available time in the hospital.

## 2021-12-06 ENCOUNTER — Encounter: Payer: BC Managed Care – PPO | Admitting: Gastroenterology

## 2021-12-06 ENCOUNTER — Telehealth: Payer: Self-pay | Admitting: Gastroenterology

## 2021-12-13 DIAGNOSIS — F411 Generalized anxiety disorder: Secondary | ICD-10-CM | POA: Diagnosis not present

## 2021-12-14 ENCOUNTER — Encounter: Payer: Self-pay | Admitting: Behavioral Health

## 2021-12-14 ENCOUNTER — Other Ambulatory Visit: Payer: Self-pay

## 2021-12-14 ENCOUNTER — Ambulatory Visit (INDEPENDENT_AMBULATORY_CARE_PROVIDER_SITE_OTHER): Payer: BC Managed Care – PPO | Admitting: Behavioral Health

## 2021-12-14 VITALS — BP 124/72 | HR 61 | Ht 70.0 in | Wt 166.0 lb

## 2021-12-14 DIAGNOSIS — F411 Generalized anxiety disorder: Secondary | ICD-10-CM

## 2021-12-14 DIAGNOSIS — F331 Major depressive disorder, recurrent, moderate: Secondary | ICD-10-CM | POA: Diagnosis not present

## 2021-12-14 MED ORDER — VILAZODONE HCL 20 MG PO TABS: 20.0000 mg | ORAL_TABLET | Freq: Every day | ORAL | 1 refills | Status: DC

## 2021-12-14 NOTE — Progress Notes (Signed)
[facility] MD/PA/NP Initial Note  12/14/2021 4:24 PM Matthew Novak  MRN:  [id number]  Chief Complaint:  Chief Complaint   Follow-up; Patient Education; Establish Care; Medication Problem; Anxiety; Depression     HPI:   46  year old male presents to this office for initial visit and to establish care. Says that he has struggled with anxiety since his teen years but end up going on medication in 2005. He has tried multiple medication before settling on Lexapro the las few years. He is currently taking 20 mg because insurance was refusing to pay for 40 mg due to this being over the recommended maximum dosage. Says he thought the medication was starting to lose its effectiveness and he would like to consider a new medication that has little side effects.  He endorses irritability, lack of need for sleep, racing thoughts and trouble concentrating.  Endorses daily Cannabis use mostly at bedtime for anxiety and sleep. He acknowledges that antidepressant medication has worked well for him in the past. He says his anxiety today is 4/10 and depression is 6/10.  He denies mania, no psychosis, no SI/HI.  Past psychiatric medication trial list; Cymbalta  Celexa Wellbutrin Seroquel Effexor Valium Lexapro   Visit Diagnosis:    ICD-10-CM   1. Generalized anxiety disorder  F41.1 Vilazodone HCl 20 MG TABS    2. Major depressive disorder, recurrent episode, moderate (HCC)  F33.1 Vilazodone HCl 20 MG TABS      Past Psychiatric History: Anxiety, MDD  Past Medical History:  Past Medical History:  Diagnosis Date   Allergy    Anxiety    Arthritis    Depression    Hyperlipidemia    Hypertension    stop drinking ETOH- no longer needs meds or has high BP   Hypothyroidism    Substance abuse ([place])     Past Surgical History:  Procedure Laterality Date   CARPAL TUNNEL WITH CUBITAL TUNNEL Right    CERVICAL DISC ARTHROPLASTY     FEMORAL ARTERY EXPLORATION Right 05/02/2016   Procedure: RIGHT GROIN  EVACUATION HEMATOMA WITH BYPASS OF RIGHT SUPERFICIAL FEMORAL ARTERY USING SAPHENOUS VEIN;  Surgeon: Rosetta Posner, MD;  Location: [facility];  Service: Vascular;  Laterality: Right;   GRAFT APPLICATION Left 06/07/4480   Procedure: [employer] GRAFT APPLICATION;  Surgeon: Verner Mould, MD;  Location: [facility];  Service: Orthopedics;  Laterality: Left;   INCISION AND DRAINAGE OF WOUND Left 09/09/2019   Procedure: Left thumb irrigation and debridement with integra application and surgery as indicated;  Surgeon: Verner Mould, MD;  Location: [facility];  Service: Orthopedics;  Laterality: Left;  12min   MOUTH SURGERY     ulnar nerve Right     Family Psychiatric History: none noted this visit   Family History:  Family History  Problem Relation Age of Onset   Depression Mother    Anxiety disorder Mother    Hypertension Mother    Alcohol abuse Father    Depression Father    Anxiety disorder Father    Heart disease Father    Hypertension Father    Alcohol abuse Brother    Depression Brother    Anxiety disorder Brother    Hypertension Brother    Colon cancer Neg Hx    Esophageal cancer Neg Hx    Rectal cancer Neg Hx    Stomach cancer Neg Hx     Social History:  Social History   Socioeconomic History  Marital status: Married    Spouse name: Alyse Low   Number of children: 1   Years of education: 12   Highest education level: Some college, no degree  Occupational History   Occupation: Aeronautical engineer  Tobacco Use   Smoking status: Every Day    Packs/day: 0.50    Years: 26.00    Pack years: 13.00    Types: Cigarettes   Smokeless tobacco: Never  Vaping Use   Vaping Use: Some days  Substance and Sexual Activity   Alcohol use: Not Currently    Alcohol/week: 0.0 standard drinks    Comment: 1 year sober   Drug use: Yes    Types: Marijuana    Comment: daily   Sexual activity: Yes  Other Topics Concern   Not on file  Social History  Narrative   Lives in [place] with wife and 40 year old.    Social Determinants of Health   Financial Resource Strain: Not on file  Food Insecurity: Not on file  Transportation Needs: Not on file  Physical Activity: Not on file  Stress: Not on file  Social Connections: Not on file    Allergies:  Allergies  Allergen Reactions   Contrast Media [Iodinated Contrast Media] Hives    Metabolic Disorder Labs: No results found for: HGBA1C, MPG No results found for: PROLACTIN Lab Results  Component Value Date   CHOL 167 10/18/2021   TRIG 118 10/18/2021   HDL 34 (L) 10/18/2021   CHOLHDL 4.9 10/18/2021   VLDL 19 06/27/2017   LDLCALC 110 (H) 10/18/2021   LDLCALC 163 (H) 07/15/2021   Lab Results  Component Value Date   TSH 1.46 07/15/2021   TSH 1.41 07/14/2020    Therapeutic Level Labs: No results found for: LITHIUM No results found for: VALPROATE No components found for:  CBMZ  Current Medications: Current Outpatient Medications  Medication Sig Dispense Refill   escitalopram (LEXAPRO) 20 MG tablet TAKE 1 TABLET(20 MG) BY MOUTH ONCE DAILY 30 tablet 5   levothyroxine (SYNTHROID) 50 MCG tablet TAKE 1 TABLET(50 MCG) BY MOUTH DAILY 90 tablet 1   QUEtiapine (SEROQUEL) 50 MG tablet TAKE 1 TABLET(50 MG) BY MOUTH AT BEDTIME 90 tablet 1   rosuvastatin (CRESTOR) 10 MG tablet Take 1 tablet (10 mg total) by mouth daily. 90 tablet 1   Vilazodone HCl 20 MG TABS Take 1 tablet (20 mg total) by mouth daily. 30 tablet 1   No current facility-administered medications for this visit.    Medication Side Effects: none  Orders placed this visit:  No orders of the defined types were placed in this encounter.   Psychiatric Specialty Exam:  Review of Systems  Constitutional:  Positive for fatigue and unexpected weight change.  Gastrointestinal:  Positive for nausea.  Endocrine: Positive for polydipsia.  Musculoskeletal:  Positive for back pain, joint swelling, myalgias and neck pain.   Neurological:  Positive for weakness and headaches.  Psychiatric/Behavioral:  Positive for decreased concentration and dysphoric mood. The patient is nervous/anxious.    There were no vitals taken for this visit.There is no height or weight on file to calculate BMI.  General Appearance: Casual and Neat  Eye Contact:  Good  Speech:  Clear and Coherent  Volume:  Normal  Mood:  Depressed and Dysphoric  Affect:  Appropriate, Depressed, Flat, and Anxious  Thought Process:  Coherent  Orientation:  Full (Time, Place, and Person)  Thought Content: Logical   Suicidal Thoughts:  No  Homicidal Thoughts:  No  Memory:  WNL  Judgement:  Good  Insight:  Good  Psychomotor Activity:  Normal  Concentration:  Concentration: Good  Recall:  Good  Fund of Knowledge: Good  Language: Good  Assets:  Desire for Improvement  ADL's:  Intact  Cognition: WNL  Prognosis:  Good   Screenings:  PHQ2-9    Flowsheet Row Office Visit from 12/14/2021 in [facility] Office Visit from 10/21/2021 in [facility] Office Visit from 07/15/2021 in [facility] Office Visit from 07/03/2018 in [facility] Office Visit from 06/27/2017 in [facility], [facility]  PHQ-2 Total Score 6 6 6   [Simultaneous filing. User may not have seen previous data.] 6 0  PHQ-9 Total Score 20 19 15   [Simultaneous filing. User may not have seen previous data.] 17 --       Receiving Psychotherapy: No   Treatment Plan/Recommendations:   Greater than 50% of face to face time with patient was spent on counseling and coordination of care. We discussed his long hx of anxiety and depression since teen years.  Discussed medication alternatives and treatment options. Reviewed his goals and expectations for tx. Educated on risk of long term and frequent cannabis use base on new evidence based research. We agreed this visit: To reduce Lexapro 20 mg down to 10 mg for one week and then stop. To start Viibrd 10 mg for 7  days, then 20 mg daily To continue Seroquel 50 mg at bedtime for sleep Will report side effects or worsening symptoms To follow up in 4 weeks to reassess Provided emergency contact information Discussed potential metabolic side effects associated with atypical antipsychotics, as well as potential risk for movement side effects. Advised pt to contact office if movement side effects occur.  Reviewed PDMP          Elwanda Brooklyn, NP

## 2021-12-17 ENCOUNTER — Telehealth: Payer: Self-pay

## 2021-12-17 ENCOUNTER — Other Ambulatory Visit: Payer: Self-pay

## 2021-12-17 DIAGNOSIS — Z1211 Encounter for screening for malignant neoplasm of colon: Secondary | ICD-10-CM

## 2021-12-17 NOTE — Telephone Encounter (Signed)
Offered patient opening at [facility] hospital for his colonoscopy on 12/20/21 at 10:30am. Patient agreed. Informed patient I will put all his instructions on Mychart to review and if he has questions to call our office back. Patient verbalized understanding.

## 2021-12-17 NOTE — Telephone Encounter (Signed)
Called central scheduling for endo and Matthew Novak states the spot that I offered is no longer available. Called patient to inform him that I unfortunately do not have a spot for him on 12/20/21 but I can schedule him for the next available endo spot which is 02/24/22. Patient agreed. Scheduled patient for 02/24/22 at 8:30am. Case # [id number]

## 2021-12-27 ENCOUNTER — Other Ambulatory Visit: Payer: Self-pay | Admitting: Internal Medicine

## 2021-12-27 DIAGNOSIS — F411 Generalized anxiety disorder: Secondary | ICD-10-CM | POA: Diagnosis not present

## 2022-01-04 DIAGNOSIS — G8929 Other chronic pain: Secondary | ICD-10-CM | POA: Diagnosis not present

## 2022-01-04 DIAGNOSIS — M542 Cervicalgia: Secondary | ICD-10-CM | POA: Diagnosis not present

## 2022-01-04 DIAGNOSIS — M5412 Radiculopathy, cervical region: Secondary | ICD-10-CM | POA: Diagnosis not present

## 2022-01-04 DIAGNOSIS — M25511 Pain in right shoulder: Secondary | ICD-10-CM | POA: Diagnosis not present

## 2022-01-10 DIAGNOSIS — F411 Generalized anxiety disorder: Secondary | ICD-10-CM | POA: Diagnosis not present

## 2022-01-12 ENCOUNTER — Ambulatory Visit: Payer: BC Managed Care – PPO | Admitting: Behavioral Health

## 2022-01-12 ENCOUNTER — Other Ambulatory Visit: Payer: Self-pay

## 2022-01-12 ENCOUNTER — Encounter: Payer: Self-pay | Admitting: Behavioral Health

## 2022-01-12 DIAGNOSIS — M542 Cervicalgia: Secondary | ICD-10-CM | POA: Diagnosis not present

## 2022-01-12 DIAGNOSIS — F411 Generalized anxiety disorder: Secondary | ICD-10-CM

## 2022-01-12 DIAGNOSIS — F331 Major depressive disorder, recurrent, moderate: Secondary | ICD-10-CM | POA: Diagnosis not present

## 2022-01-12 MED ORDER — BUSPIRONE HCL 15 MG PO TABS: ORAL_TABLET | ORAL | 1 refills | Status: DC

## 2022-01-12 MED ORDER — VILAZODONE HCL 40 MG PO TABS: 40.0000 mg | ORAL_TABLET | Freq: Every day | ORAL | 2 refills | Status: DC

## 2022-01-12 NOTE — Progress Notes (Signed)
[facility] Med Check ? ?Patient ID: Matthew Novak,  ?MRN: [id number] ? ?PCP: Elby Showers, MD ? ?Date of Evaluation: 01/12/2022 ?Time spent:20 minutes ? ?Chief Complaint:  ? ?HISTORY/CURRENT STATUS: ?HPI ? ?46  year old male presents to this office for follow up  and medication management. Says that he has had minimal improvement with anxiety and moderate with depression. He is requesting dosage increase of Viibryd today and another medication that can help with just the anxiety.   Endorses daily Cannabis use mostly at bedtime for anxiety and sleep.  Says his sleep is good right now. He says his anxiety today is 6/10 and depression is 5/10.  He denies mania, no psychosis, no SI/HI. ?  ?Past psychiatric medication trial list; ?Cymbalta  ?Celexa ?Wellbutrin ?Seroquel ?Effexor ?Valium ?Lexapro ? ? ?Individual Medical History/ Review of Systems: Changes? :No  ? ?Allergies: Contrast media [iodinated contrast media] ? ?Current Medications:  ?Current Outpatient Medications:  ?  busPIRone (BUSPAR) 15 MG tablet, Take 1/3 tablet p.o. twice daily for 1 week, then take 2/3 tablet p.o. twice daily for 1 week, then take 1 tablet p.o. twice daily, Disp: 60 tablet, Rfl: 1 ?  Vilazodone HCl (VIIBRYD) 40 MG TABS, Take 1 tablet (40 mg total) by mouth daily., Disp: 30 tablet, Rfl: 2 ?  levothyroxine (SYNTHROID) 50 MCG tablet, TAKE 1 TABLET(50 MCG) BY MOUTH DAILY, Disp: 90 tablet, Rfl: 1 ?  QUEtiapine (SEROQUEL) 50 MG tablet, TAKE 1 TABLET(50 MG) BY MOUTH AT BEDTIME, Disp: 90 tablet, Rfl: 1 ?  rosuvastatin (CRESTOR) 10 MG tablet, Take 1 tablet (10 mg total) by mouth daily., Disp: 90 tablet, Rfl: 1 ?Medication Side Effects: none ? ?Family Medical/ Social History: Changes? No ? ?MENTAL HEALTH EXAM: ? ?There were no vitals taken for this visit.There is no height or weight on file to calculate BMI.  ?General Appearance: Casual  ?Eye Contact:  Good  ?Speech:  Clear and Coherent  ?Volume:  Normal  ?Mood:  Anxious  ?Affect:  Appropriate   ?Thought Process:  Coherent  ?Orientation:  Full (Time, Place, and Person)  ?Thought Content: Logical   ?Suicidal Thoughts:  No  ?Homicidal Thoughts:  No  ?Memory:  WNL  ?Judgement:  Good  ?Insight:  Good  ?Psychomotor Activity:  Normal  ?Concentration:  Concentration: Good  ?Recall:  Good  ?Fund of Knowledge: Good  ?Language: Good  ?Assets:  Desire for Improvement  ?ADL's:  Intact  ?Cognition: WNL  ?Prognosis:  Good  ? ? ?DIAGNOSES:  ?  ICD-10-CM   ?1. Generalized anxiety disorder  F41.1 busPIRone (BUSPAR) 15 MG tablet  ?  Vilazodone HCl (VIIBRYD) 40 MG TABS  ?  ?2. Major depressive disorder, recurrent episode, moderate (HCC)  F33.1 busPIRone (BUSPAR) 15 MG tablet  ?  Vilazodone HCl (VIIBRYD) 40 MG TABS  ?  ? ? ?Receiving Psychotherapy: No  ? ? ?RECOMMENDATIONS:  ? ?Greater than 50% of 20 min face to face time with patient was spent on counseling and coordination of care. Discussed his moderate level improvement with depression but minimal with anxiety.  Discussed medication alternatives and treatment options.  ?We agreed to: ?To increase Viibryd to 40 mg daily ?Discussed potential benefits, risks, and side effects of BuSpar.  Patient agrees to trial of BuSpar.  Will start BuSpar 15 mg 1/3 tablet twice daily for 1 week, then increase to 2/3 tablet twice daily for 1 week, then increase to 1 tablet twice daily for anxiety.  ?To continue Seroquel 50 mg at bedtime  for sleep ?Will report side effects or worsening symptoms ?To follow up in 4 weeks to reassess ?Provided emergency contact information ?Discussed potential metabolic side effects associated with atypical antipsychotics, as well as potential risk for movement side effects. Advised pt to contact office if movement side effects occur.  ?Reviewed PDMP  ?  ? ? ?Elwanda Brooklyn, NP  ?

## 2022-01-19 DIAGNOSIS — M25511 Pain in right shoulder: Secondary | ICD-10-CM | POA: Diagnosis not present

## 2022-01-19 DIAGNOSIS — M47812 Spondylosis without myelopathy or radiculopathy, cervical region: Secondary | ICD-10-CM | POA: Diagnosis not present

## 2022-01-19 DIAGNOSIS — M4322 Fusion of spine, cervical region: Secondary | ICD-10-CM | POA: Diagnosis not present

## 2022-01-21 ENCOUNTER — Other Ambulatory Visit: Payer: Self-pay | Admitting: Internal Medicine

## 2022-01-31 DIAGNOSIS — F411 Generalized anxiety disorder: Secondary | ICD-10-CM | POA: Diagnosis not present

## 2022-02-14 ENCOUNTER — Other Ambulatory Visit: Payer: Self-pay | Admitting: Internal Medicine

## 2022-02-14 DIAGNOSIS — F411 Generalized anxiety disorder: Secondary | ICD-10-CM | POA: Diagnosis not present

## 2022-02-17 ENCOUNTER — Encounter (HOSPITAL_COMMUNITY): Payer: Self-pay | Admitting: Gastroenterology

## 2022-02-21 DIAGNOSIS — M47812 Spondylosis without myelopathy or radiculopathy, cervical region: Secondary | ICD-10-CM | POA: Diagnosis not present

## 2022-02-23 ENCOUNTER — Ambulatory Visit: Payer: BC Managed Care – PPO | Admitting: Behavioral Health

## 2022-02-23 ENCOUNTER — Encounter: Payer: Self-pay | Admitting: Behavioral Health

## 2022-02-23 DIAGNOSIS — F331 Major depressive disorder, recurrent, moderate: Secondary | ICD-10-CM

## 2022-02-23 DIAGNOSIS — F99 Mental disorder, not otherwise specified: Secondary | ICD-10-CM | POA: Diagnosis not present

## 2022-02-23 DIAGNOSIS — F5105 Insomnia due to other mental disorder: Secondary | ICD-10-CM | POA: Diagnosis not present

## 2022-02-23 DIAGNOSIS — F411 Generalized anxiety disorder: Secondary | ICD-10-CM

## 2022-02-23 MED ORDER — BUSPIRONE HCL 15 MG PO TABS: ORAL_TABLET | ORAL | 3 refills | Status: DC

## 2022-02-23 NOTE — Anesthesia Preprocedure Evaluation (Addendum)
Anesthesia Evaluation  ?Patient identified by MRN, date of birth, ID band ?Patient awake ? ? ? ?Reviewed: ?Allergy & Precautions, NPO status , Patient's Chart, lab work & pertinent test results ? ?History of Anesthesia Complications ?Negative for: history of anesthetic complications ? ?Airway ?Mallampati: II ? ?TM Distance: >3 FB ?Neck ROM: Limited ? ? ? Dental ?no notable dental hx. ? ?  ?Pulmonary ?Current Smoker and Patient abstained from smoking.,  ?  ?Pulmonary exam normal ? ? ? ? ? ? ? Cardiovascular ?hypertension, Normal cardiovascular exam ? ? ?  ?Neuro/Psych ?Anxiety Depression S/p cervical arthroplasty ?  ? GI/Hepatic ?negative GI ROS, Neg liver ROS,   ?Endo/Other  ?Hypothyroidism  ? Renal/GU ?negative Renal ROS  ?negative genitourinary ?  ?Musculoskeletal ? ?(+) Arthritis ,  ? Abdominal ?  ?Peds ? Hematology ?negative hematology ROS ?(+)   ?Anesthesia Other Findings ?Day of surgery medications reviewed with patient. ? Reproductive/Obstetrics ?negative OB ROS ? ?  ? ? ? ? ? ? ? ? ? ? ? ? ? ?  ?  ? ? ? ? ? ? ? ?Anesthesia Physical ?Anesthesia Plan ? ?ASA: 2 ? ?Anesthesia Plan: MAC  ? ?Post-op Pain Management: Minimal or no pain anticipated  ? ?Induction:  ? ?PONV Risk Score and Plan: Treatment may vary due to age or medical condition and Propofol infusion ? ?Airway Management Planned: Natural Airway and Nasal Cannula ? ?Additional Equipment: None ? ?Intra-op Plan:  ? ?Post-operative Plan:  ? ?Informed Consent: I have reviewed the patients History and Physical, chart, labs and discussed the procedure including the risks, benefits and alternatives for the proposed anesthesia with the patient or authorized representative who has indicated his/her understanding and acceptance.  ? ? ? ? ? ?Plan Discussed with: CRNA ? ?Anesthesia Plan Comments:   ? ? ? ? ? ?Anesthesia Quick Evaluation ? ?

## 2022-02-23 NOTE — Progress Notes (Signed)
[facility] ? ?Patient ID: Matthew Novak,  ?MRN: [id number] ? ?PCP: Elby Showers, MD ? ?Date of Evaluation: 02/23/2022 ?Time spent:20 minutes ? ?Chief Complaint:  ?Chief Complaint   ?Anxiety; Depression; Follow-up; Medication Refill; Medication Problem ?  ? ? ?HISTORY/CURRENT STATUS: ?HPI ? ?46  year old male presents to this office for follow up  and medication management. Says that he has had moderate improvement with anxiety but great with depression. He is requesting dosage increase of Buspar today. Endorses daily Cannabis use mostly at bedtime for anxiety and sleep.  Says his sleep is good right now. He says his anxiety today is 4/10 and depression is 1/10.  He denies mania, no psychosis, no SI/HI. ?  ?Past psychiatric medication trial list; ?Cymbalta  ?Celexa ?Wellbutrin ?Seroquel ?Effexor ?Valium ?Lexapro ?  ?Individual Medical History/ Review of Systems: Changes? :No  ? ?Allergies: Contrast media [iodinated contrast media] ? ?Current Medications:  ?Current Outpatient Medications:  ?  busPIRone (BUSPAR) 15 MG tablet, Take one tablet three times daily, at least 8 hours between doses (total 45 mg daily)., Disp: 90 tablet, Rfl: 3 ?  levothyroxine (SYNTHROID) 50 MCG tablet, TAKE 1 TABLET(50 MCG) BY MOUTH DAILY (Patient taking differently: Take 50 mcg by mouth daily before breakfast.), Disp: 90 tablet, Rfl: 1 ?  QUEtiapine (SEROQUEL) 50 MG tablet, TAKE 1 TABLET(50 MG) BY MOUTH AT BEDTIME (Patient taking differently: Take 50 mg by mouth at bedtime.), Disp: 90 tablet, Rfl: 1 ?  rosuvastatin (CRESTOR) 10 MG tablet, TAKE 1 TABLET(10 MG) BY MOUTH DAILY (Patient taking differently: Take 10 mg by mouth daily.), Disp: 90 tablet, Rfl: 1 ?  Vilazodone HCl (VIIBRYD) 40 MG TABS, Take 1 tablet (40 mg total) by mouth daily., Disp: 30 tablet, Rfl: 2 ?Medication Side Effects: none ? ?Family Medical/ Social History: Changes? No ? ?MENTAL HEALTH EXAM: ? ?There were no vitals taken for this visit.There is no height  or weight on file to calculate BMI.  ?General Appearance: Casual, Neat, and Well Groomed  ?Eye Contact:  Good  ?Speech:  Clear and Coherent  ?Volume:  Normal  ?Mood:  Anxious  ?Affect:  Appropriate and Anxious  ?Thought Process:  Coherent  ?Orientation:  Full (Time, Place, and Person)  ?Thought Content: Logical   ?Suicidal Thoughts:  No  ?Homicidal Thoughts:  No  ?Memory:  WNL  ?Judgement:  Good  ?Insight:  Good  ?Psychomotor Activity:  Normal  ?Concentration:  Concentration: Good  ?Recall:  Good  ?Fund of Knowledge: Good  ?Language: Good  ?Assets:  Desire for Improvement  ?ADL's:  Intact  ?Cognition: WNL  ?Prognosis:  Good  ? ? ?DIAGNOSES:  ?  ICD-10-CM   ?1. Generalized anxiety disorder  F41.1 busPIRone (BUSPAR) 15 MG tablet  ?  ?2. Major depressive disorder, recurrent episode, moderate (HCC)  F33.1 busPIRone (BUSPAR) 15 MG tablet  ?  ?3. Insomnia due to other mental disorder  F51.05   ? F99   ?  ? ? ?Receiving Psychotherapy: No  ? ? ?RECOMMENDATIONS:  ? ?Greater than 50% of 20 min face to face time with patient was spent on counseling and coordination of care. Discussed his significant level improvement with depression but moderate with anxiety.  Discussed medication alternatives and increase of his Buspar. ?We agreed to: ?Continue Viibryd to 40 mg daily ?Increase Buspar to 15 mg three times daily ?To continue Seroquel 50 mg at bedtime for sleep ?Will report side effects or worsening symptoms ?To follow up in 4 weeks to  reassess ?Provided emergency contact information ?Discussed potential metabolic side effects associated with atypical antipsychotics, as well as potential risk for movement side effects. Advised pt to contact office if movement side effects occur.  ?Reviewed PDMP  ? ? ?Elwanda Brooklyn, NP  ?

## 2022-02-24 ENCOUNTER — Other Ambulatory Visit: Payer: Self-pay

## 2022-02-24 ENCOUNTER — Ambulatory Visit (HOSPITAL_COMMUNITY): Payer: BC Managed Care – PPO | Admitting: Anesthesiology

## 2022-02-24 ENCOUNTER — Ambulatory Visit (HOSPITAL_COMMUNITY)
Admission: RE | Admit: 2022-02-24 | Discharge: 2022-02-24 | Disposition: A | Discharge status: Home / Self Care | Payer: BC Managed Care – PPO | Source: Ambulatory Visit | Attending: Gastroenterology | Admitting: Gastroenterology

## 2022-02-24 ENCOUNTER — Encounter (HOSPITAL_COMMUNITY)
Admission: RE | Disposition: A | Discharge status: Home / Self Care | Payer: Self-pay | Source: Ambulatory Visit | Attending: Gastroenterology

## 2022-02-24 ENCOUNTER — Encounter (HOSPITAL_COMMUNITY): Payer: Self-pay | Admitting: Gastroenterology

## 2022-02-24 DIAGNOSIS — Z139 Encounter for screening, unspecified: Secondary | ICD-10-CM | POA: Diagnosis not present

## 2022-02-24 DIAGNOSIS — E039 Hypothyroidism, unspecified: Secondary | ICD-10-CM | POA: Diagnosis not present

## 2022-02-24 DIAGNOSIS — I10 Essential (primary) hypertension: Secondary | ICD-10-CM | POA: Diagnosis not present

## 2022-02-24 DIAGNOSIS — K640 First degree hemorrhoids: Secondary | ICD-10-CM | POA: Diagnosis not present

## 2022-02-24 DIAGNOSIS — F418 Other specified anxiety disorders: Secondary | ICD-10-CM | POA: Diagnosis not present

## 2022-02-24 DIAGNOSIS — K635 Polyp of colon: Secondary | ICD-10-CM | POA: Diagnosis not present

## 2022-02-24 DIAGNOSIS — D128 Benign neoplasm of rectum: Secondary | ICD-10-CM

## 2022-02-24 DIAGNOSIS — F17210 Nicotine dependence, cigarettes, uncomplicated: Secondary | ICD-10-CM | POA: Diagnosis not present

## 2022-02-24 DIAGNOSIS — D125 Benign neoplasm of sigmoid colon: Secondary | ICD-10-CM | POA: Diagnosis not present

## 2022-02-24 DIAGNOSIS — Z1211 Encounter for screening for malignant neoplasm of colon: Secondary | ICD-10-CM | POA: Insufficient documentation

## 2022-02-24 DIAGNOSIS — D123 Benign neoplasm of transverse colon: Secondary | ICD-10-CM | POA: Diagnosis not present

## 2022-02-24 HISTORY — PX: POLYPECTOMY: SHX5525

## 2022-02-24 HISTORY — PX: COLONOSCOPY WITH PROPOFOL: SHX5780

## 2022-02-24 SURGERY — COLONOSCOPY WITH PROPOFOL
Anesthesia: Monitor Anesthesia Care

## 2022-02-24 MED ORDER — PROPOFOL 1000 MG/100ML IV EMUL
INTRAVENOUS | Status: AC
  Filled 2022-02-24: qty 100

## 2022-02-24 MED ORDER — PROPOFOL 500 MG/50ML IV EMUL
INTRAVENOUS | Status: DC | PRN
Start: 2022-02-24 — End: 2022-02-24
  Administered 2022-02-24: 300 ug/kg/min via INTRAVENOUS

## 2022-02-24 MED ORDER — LACTATED RINGERS IV SOLN: INTRAVENOUS | Status: DC

## 2022-02-24 MED ORDER — PROPOFOL 10 MG/ML IV BOLUS: INTRAVENOUS | Status: DC | PRN

## 2022-02-24 MED ORDER — SODIUM CHLORIDE 0.9 % IV SOLN: INTRAVENOUS | Status: DC

## 2022-02-24 SURGICAL SUPPLY — 22 items

## 2022-02-24 NOTE — Transfer of Care (Signed)
Immediate Anesthesia Transfer of Care Note ? ?Patient: Matthew Novak ? ?Procedure(s) Performed: COLONOSCOPY WITH PROPOFOL ?POLYPECTOMY ? ?Patient Location: PACU ? ?Anesthesia Type:MAC ? ?Level of Consciousness: awake, alert  and patient cooperative ? ?Airway & Oxygen Therapy: Patient Spontanous Breathing and Patient connected to face mask oxygen ? ?Post-op Assessment: Report given to RN, Post -op Vital signs reviewed and stable and Patient moving all extremities X 4 ? ?Post vital signs: stable ? ?Last Vitals:  ?Vitals Value Taken Time  ?BP 115/73 02/24/22 0853  ?Temp    ?Pulse 80 02/24/22 0855  ?Resp 21 02/24/22 0855  ?SpO2 100 % 02/24/22 0855  ?Vitals shown include unvalidated device data. ? ?Last Pain:  ?Vitals:  ? 02/24/22 0722  ?TempSrc: Oral  ?PainSc: 0-No pain  ?   ? ?  ? ?Complications: No notable events documented. ?

## 2022-02-24 NOTE — H&P (Signed)
? ? ?History & Physical ? ?Primary Care Physician:  Elby Showers, MD ?Primary Gastroenterologist: Lucio Edward, MD ? ?CHIEF COMPLAINT:  CRC screening ? ?HPI: Matthew Novak is a 46 y.o. male here for CRC screening, average risk, with colonoscopy.  ? ? ?Past Medical History:  ?Diagnosis Date  ? Allergy   ? Anxiety   ? Arthritis   ? Depression   ? Hyperlipidemia   ? Hypertension   ? stop drinking ETOH- no longer needs meds or has high BP  ? Hypothyroidism   ? Substance abuse ([place])   ? ? ?Past Surgical History:  ?Procedure Laterality Date  ? CARPAL TUNNEL WITH CUBITAL TUNNEL Right   ? CERVICAL DISC ARTHROPLASTY    ? FEMORAL ARTERY EXPLORATION Right 05/02/2016  ? Procedure: RIGHT GROIN EVACUATION HEMATOMA WITH BYPASS OF RIGHT SUPERFICIAL FEMORAL ARTERY USING SAPHENOUS VEIN;  Surgeon: Rosetta Posner, MD;  Location: [facility];  Service: Vascular;  Laterality: Right;  ? GRAFT APPLICATION Left 26/06/3418  ? Procedure: [employer] GRAFT APPLICATION;  Surgeon: Verner Mould, MD;  Location: [facility];  Service: Orthopedics;  Laterality: Left;  ? INCISION AND DRAINAGE OF WOUND Left 09/09/2019  ? Procedure: Left thumb irrigation and debridement with integra application and surgery as indicated;  Surgeon: Verner Mould, MD;  Location: [facility];  Service: Orthopedics;  Laterality: Left;  71mn  ? MOUTH SURGERY    ? ulnar nerve Right   ? ? ?Prior to Admission medications   ?Medication Sig Start Date End Date Taking? Authorizing Provider  ?busPIRone (BUSPAR) 15 MG tablet Take one tablet three times daily, at least 8 hours between doses (total 45 mg daily). 02/23/22  Yes White, BAaron EdelmanA, NP  ?levothyroxine (SYNTHROID) 50 MCG tablet TAKE 1 TABLET(50 MCG) BY MOUTH DAILY ?Patient taking differently: Take 50 mcg by mouth daily before breakfast. 02/14/22  Yes Baxley, MCresenciano Lick MD  ?QUEtiapine (SEROQUEL) 50 MG tablet TAKE 1 TABLET(50 MG) BY MOUTH AT BEDTIME ?Patient taking differently: Take 50 mg by  mouth at bedtime. 02/14/22  Yes Baxley, MCresenciano Lick MD  ?rosuvastatin (CRESTOR) 10 MG tablet TAKE 1 TABLET(10 MG) BY MOUTH DAILY ?Patient taking differently: Take 10 mg by mouth daily. 01/21/22  Yes Baxley, MCresenciano Lick MD  ?Vilazodone HCl (VIIBRYD) 40 MG TABS Take 1 tablet (40 mg total) by mouth daily. 01/12/22  Yes WElwanda Brooklyn NP  ? ? ?Current Facility-Administered Medications  ?Medication Dose Route Frequency Provider Last Rate Last Admin  ? 0.9 %  sodium chloride infusion   Intravenous Continuous SLadene Artist MD      ? lactated ringers infusion   Intravenous Continuous SLadene Artist MD 10 mL/hr at 02/24/22 0741 New Bag at 02/24/22 0741  ? ? ?Allergies as of 12/17/2021 - Review Complete 12/14/2021  ?Allergen Reaction Noted  ? Contrast media [iodinated contrast media] Hives 03/29/2011  ? ? ?Family History  ?Problem Relation Age of Onset  ? Depression Mother   ? Anxiety disorder Mother   ? Hypertension Mother   ? Alcohol abuse Father   ? Depression Father   ? Anxiety disorder Father   ? Heart disease Father   ? Hypertension Father   ? Alcohol abuse Brother   ? Depression Brother   ? Anxiety disorder Brother   ? Hypertension Brother   ? Colon cancer Neg Hx   ? Esophageal cancer Neg Hx   ? Rectal cancer Neg Hx   ? Stomach cancer  Neg Hx   ? ? ?Social History  ? ?Socioeconomic History  ? Marital status: Married  ?  Spouse name: Alyse Low  ? Number of children: 1  ? Years of education: 65  ? Highest education level: Some college, no degree  ?Occupational History  ? Occupation: Aeronautical engineer  ?Tobacco Use  ? Smoking status: Every Day  ?  Packs/day: 0.50  ?  Years: 26.00  ?  Pack years: 13.00  ?  Types: Cigarettes  ? Smokeless tobacco: Never  ?Vaping Use  ? Vaping Use: Some days  ?Substance and Sexual Activity  ? Alcohol use: Not Currently  ?  Alcohol/week: 0.0 standard drinks  ?  Comment: 1 year sober  ? Drug use: Yes  ?  Types: Marijuana  ?  Comment: daily  ? Sexual activity: Yes  ?Other Topics Concern  ? Not on file   ?Social History Narrative  ? Lives in [place] with wife and 64 year old.   ? ?Social Determinants of Health  ? ?Financial Resource Strain: Not on file  ?Food Insecurity: Not on file  ?Transportation Needs: Not on file  ?Physical Activity: Not on file  ?Stress: Not on file  ?Social Connections: Not on file  ?Intimate Partner Violence: Not on file  ? ? ?Review of Systems: ? ?All systems reviewed an negative except where noted in HPI. ? ?Gen: Denies any fever, chills, sweats, anorexia, fatigue, weakness, malaise, weight loss, and sleep disorder ?CV: Denies chest pain, angina, palpitations, syncope, orthopnea, PND, peripheral edema, and claudication. ?Resp: Denies dyspnea at rest, dyspnea with exercise, cough, sputum, wheezing, coughing up blood, and pleurisy. ?GI: Denies vomiting blood, jaundice, and fecal incontinence.   Denies dysphagia or odynophagia. ?GU : Denies urinary burning, blood in urine, urinary frequency, urinary hesitancy, nocturnal urination, and urinary incontinence. ?MS: Denies joint pain, limitation of movement, and swelling, stiffness, low back pain, extremity pain. Denies muscle weakness, cramps, atrophy.  ?Derm: Denies rash, itching, dry skin, hives, moles, warts, or unhealing ulcers.  ?Psych: Denies depression, anxiety, memory loss, suicidal ideation, hallucinations, paranoia, and confusion. ?Heme: Denies bruising, bleeding, and enlarged lymph nodes. ?Neuro:  Denies any headaches, dizziness, paresthesias. ?Endo:  Denies any problems with DM, thyroid, adrenal function. ? ? ?Physical Exam: ?Vital signs in last 24 hours: ?Temp:  [98.1 ?F (36.7 ?C)] 98.1 ?F (36.7 ?C) (04/20 0623) ?Pulse Rate:  [80] 80 (04/20 0722) ?Resp:  [15] 15 (04/20 7628) ?BP: (121)/(78) 121/78 (04/20 3151) ?SpO2:  [99 %] 99 % (04/20 0722) ?Weight:  [77.1 kg] 77.1 kg (04/20 0722) ?  ?General:  Alert, well-developed, in NAD ?Head:  Normocephalic and atraumatic. ?Eyes:  Sclera clear, no icterus.   Conjunctiva pink. ?Ears:   Normal auditory acuity. ?Mouth:  No deformity or lesions.  ?Neck:  Supple; no masses . ?Lungs:  Clear throughout to auscultation.   No wheezes, crackles, or rhonchi. No acute distress. ?Heart:  Regular rate and rhythm; no murmurs. ?Abdomen:  Soft, nondistended, nontender. No masses, hepatomegaly. No obvious masses.  Normal bowel .    ?Rectal:  Deferred to colonoscopy  ?Msk:  Symmetrical without gross deformities.Marland Kitchen ?Pulses:  Normal pulses noted. ?Extremities:  Without edema. ?Neurologic:  Alert and  oriented x4;  grossly normal neurologically. ?Skin:  Intact without significant lesions or rashes. ?Cervical Nodes:  No significant cervical adenopathy. ?Psych:  Alert and cooperative. Normal mood and affect. ? ? ?Impression / Plan:  ? ?CRC screening, average risk, for colonoscopy.  ? ?Jeren Dufrane T. Fuller Plan  02/24/2022, 8:12 AM ?  See [facility], [facility], to contact our on call provider ? ? ?  ?

## 2022-02-24 NOTE — Anesthesia Postprocedure Evaluation (Signed)
Anesthesia Post Note ? ?Patient: Matthew Novak ? ?Procedure(s) Performed: COLONOSCOPY WITH PROPOFOL ?POLYPECTOMY ? ?  ? ?Patient location during evaluation: PACU ?Anesthesia Type: MAC ?Level of consciousness: awake and alert ?Pain management: pain level controlled ?Vital Signs Assessment: post-procedure vital signs reviewed and stable ?Respiratory status: spontaneous breathing, nonlabored ventilation and respiratory function stable ?Cardiovascular status: blood pressure returned to baseline ?Postop Assessment: no apparent nausea or vomiting ?Anesthetic complications: no ? ? ?No notable events documented. ? ?Last Vitals:  ?Vitals:  ? 02/24/22 0900 02/24/22 0905  ?BP: 118/74   ?Pulse: 76 75  ?Resp: 18 15  ?Temp:    ?SpO2: 97% 98%  ?  ?Last Pain:  ?Vitals:  ? 02/24/22 0853  ?TempSrc: Oral  ?PainSc: 0-No pain  ? ? ?  ?  ?  ?  ?  ?  ? ?Marthenia Rolling ? ? ? ? ?

## 2022-02-24 NOTE — Op Note (Signed)
[facility] ?Patient Name: Matthew Novak ?Procedure Date: 02/24/2022 ?MRN: [id number] ?Attending MD: Ladene Artist , MD ?Date of Birth: 07-08-1976 ?CSN: [id number] ?Age: 46 ?Admit Type: Outpatient ?Procedure:                Colonoscopy ?Indications:              Screening for colorectal malignant neoplasm, This  ?                          is the patient's first colonoscopy ?Providers:                Pricilla Riffle. Fuller Plan, MD, Jaci Carrel, RN, Charlean Merl  ?                          Purcell Nails, Technician, Enrigue Catena, CRNA ?Referring MD:             Cresenciano Lick. Renold Genta, MD ?Medicines:                Monitored Anesthesia Care ?Complications:            No immediate complications. Estimated blood loss:  ?                          None. ?Estimated Blood Loss:     Estimated blood loss: none. ?Procedure:                Pre-Anesthesia Assessment: ?                          - Prior to the procedure, a History and Physical  ?                          was performed, and patient medications and  ?                          allergies were reviewed. The patient's tolerance of  ?                          previous anesthesia was also reviewed. The risks  ?                          and benefits of the procedure and the sedation  ?                          options and risks were discussed with the patient.  ?                          All questions were answered, and informed consent  ?                          was obtained. Prior Anticoagulants: The patient has  ?                          taken no previous anticoagulant or antiplatelet  ?  agents. ASA Grade Assessment: II - A patient with  ?                          mild systemic disease. After reviewing the risks  ?                          and benefits, the patient was deemed in  ?                          satisfactory condition to undergo the procedure. ?                          After obtaining informed consent, the colonoscope  ?                           was passed under direct vision. Throughout the  ?                          procedure, the patient's blood pressure, pulse, and  ?                          oxygen saturations were monitored continuously. The  ?                          [id number]-[id number] ([id number]) Olympus colonoscope was  ?                          introduced through the anus and advanced to the the  ?                          cecum, identified by appendiceal orifice and  ?                          ileocecal valve. The ileocecal valve, appendiceal  ?                          orifice, and rectum were photographed. The quality  ?                          of the bowel preparation was good. The colonoscopy  ?                          was performed without difficulty. The patient  ?                          tolerated the procedure well. ?Scope In: 8:29:54 AM ?Scope Out: 8:45:43 AM ?Scope Withdrawal Time: 0 hours 12 minutes 51 seconds  ?Total Procedure Duration: 0 hours 15 minutes 49 seconds  ?Findings: ?     The perianal and digital rectal examinations were normal. ?     Three sessile polyps were found in the rectum, sigmoid colon and  ?     transverse colon. The polyps were 6 to 8 mm in size. These polyps were  ?     removed with a cold snare.  Resection and retrieval were complete. ?     Internal hemorrhoids were found during retroflexion. The hemorrhoids  ?     were moderate and Grade I (internal hemorrhoids that do not prolapse). ?     The exam was otherwise without abnormality on direct and retroflexion  ?     views. ?Impression:               - Three 6 to 8 mm polyps in the rectum, in the  ?                          sigmoid colon and in the transverse colon, removed  ?                          with a cold snare. Resected and retrieved. ?                          - Internal hemorrhoids. ?                          - The examination was otherwise normal on direct  ?                          and retroflexion views. ?Moderate Sedation: ?     Not Applicable -  Patient had care per Anesthesia. ?Recommendation:           - Repeat colonoscopy after studies are complete for  ?                          surveillance based on pathology results. ?                          - Patient has a contact number available for  ?                          emergencies. The signs and symptoms of potential  ?                          delayed complications were discussed with the  ?                          patient. Return to normal activities tomorrow.  ?                          Written discharge instructions were provided to the  ?                          patient. ?                          - Resume previous diet. ?                          - Continue present medications. ?                          - Await pathology results. ?Procedure  Code(s):        --- Professional --- ?                          [phone], Colonoscopy, flexible; with removal of  ?                          tumor(s), polyp(s), or other lesion(s) by snare  ?                          technique ?Diagnosis Code(s):        --- Professional --- ?                          Z12.11, Encounter for screening for malignant  ?                          neoplasm of colon ?                          K62.1, Rectal polyp ?                          K63.5, Polyp of colon ?                          K64.0, First degree hemorrhoids ?CPT copyright 2019 American Medical Association. All rights reserved. ?The codes documented in this report are preliminary and upon coder review may  ?be revised to meet current compliance requirements. ?Ladene Artist, MD ?02/24/2022 8:54:35 AM ?This report has been signed electronically. ?Number of Addenda: 0 ?

## 2022-02-24 NOTE — Discharge Instructions (Signed)
YOU HAD AN ENDOSCOPIC PROCEDURE TODAY: Refer to the procedure report and other information in the discharge instructions given to you for any specific questions about what was found during the examination. If this information does not answer your questions, please call [place] office at [phone] to clarify.  ° °YOU SHOULD EXPECT: Some feelings of bloating in the abdomen. Passage of more gas than usual. Walking can help get rid of the air that was put into your GI tract during the procedure and reduce the bloating. If you had a lower endoscopy (such as a colonoscopy or flexible sigmoidoscopy) you may notice spotting of blood in your stool or on the toilet paper. Some abdominal soreness may be present for a day or two, also. ° °DIET: Your first meal following the procedure should be a light meal and then it is ok to progress to your normal diet. A half-sandwich or bowl of soup is an example of a good first meal. Heavy or fried foods are harder to digest and may make you feel nauseous or bloated. Drink plenty of fluids but you should avoid alcoholic beverages for 24 hours. If you had a esophageal dilation, please see attached instructions for diet.   ° °ACTIVITY: Your care partner should take you home directly after the procedure. You should plan to take it easy, moving slowly for the rest of the day. You can resume normal activity the day after the procedure however YOU SHOULD NOT DRIVE, use power tools, machinery or perform tasks that involve climbing or major physical exertion for 24 hours (because of the sedation medicines used during the test).  ° °SYMPTOMS TO REPORT IMMEDIATELY: °A gastroenterologist can be reached at any hour. Please call [phone]  for any of the following symptoms:  °Following lower endoscopy (colonoscopy, flexible sigmoidoscopy) °Excessive amounts of blood in the stool  °Significant tenderness, worsening of abdominal pains  °Swelling of the abdomen that is new, acute  °Fever of 100° or  higher  °Following upper endoscopy (EGD, EUS, ERCP, esophageal dilation) °Vomiting of blood or coffee ground material  °New, significant abdominal pain  °New, significant chest pain or pain under the shoulder blades  °Painful or persistently difficult swallowing  °New shortness of breath  °Black, tarry-looking or red, bloody stools ° °FOLLOW UP:  °If any biopsies were taken you will be contacted by phone or by letter within the next 1-3 weeks. Call [phone]  if you have not heard about the biopsies in 3 weeks.  °Please also call with any specific questions about appointments or follow up tests. ° °

## 2022-02-24 NOTE — Anesthesia Procedure Notes (Signed)
Procedure Name: [place] ?Date/Time: 02/24/2022 8:23 AM ?Performed by: Lissa Morales, CRNA ?Pre-anesthesia Checklist: Patient identified, Emergency Drugs available, Suction available, Patient being monitored and Timeout performed ?Patient Re-evaluated:Patient Re-evaluated prior to induction ?Oxygen Delivery Method: Simple face mask ?Placement Confirmation: positive ETCO2 ? ? ? ? ?

## 2022-02-25 LAB — SURGICAL PATHOLOGY

## 2022-03-01 ENCOUNTER — Encounter: Payer: Self-pay | Admitting: Gastroenterology

## 2022-03-01 DIAGNOSIS — Z79899 Other long term (current) drug therapy: Secondary | ICD-10-CM | POA: Diagnosis not present

## 2022-03-01 DIAGNOSIS — I7300 Raynaud's syndrome without gangrene: Secondary | ICD-10-CM | POA: Diagnosis not present

## 2022-03-01 DIAGNOSIS — M453 Ankylosing spondylitis of cervicothoracic region: Secondary | ICD-10-CM | POA: Diagnosis not present

## 2022-03-01 DIAGNOSIS — M2550 Pain in unspecified joint: Secondary | ICD-10-CM | POA: Diagnosis not present

## 2022-03-07 DIAGNOSIS — F411 Generalized anxiety disorder: Secondary | ICD-10-CM | POA: Diagnosis not present

## 2022-03-21 DIAGNOSIS — F411 Generalized anxiety disorder: Secondary | ICD-10-CM | POA: Diagnosis not present

## 2022-04-05 DIAGNOSIS — M47812 Spondylosis without myelopathy or radiculopathy, cervical region: Secondary | ICD-10-CM | POA: Diagnosis not present

## 2022-04-05 DIAGNOSIS — M25511 Pain in right shoulder: Secondary | ICD-10-CM | POA: Diagnosis not present

## 2022-04-11 DIAGNOSIS — M5412 Radiculopathy, cervical region: Secondary | ICD-10-CM | POA: Diagnosis not present

## 2022-04-17 ENCOUNTER — Other Ambulatory Visit: Payer: Self-pay | Admitting: Behavioral Health

## 2022-04-17 DIAGNOSIS — F411 Generalized anxiety disorder: Secondary | ICD-10-CM

## 2022-04-17 DIAGNOSIS — F331 Major depressive disorder, recurrent, moderate: Secondary | ICD-10-CM

## 2022-04-18 DIAGNOSIS — F411 Generalized anxiety disorder: Secondary | ICD-10-CM | POA: Diagnosis not present

## 2022-04-20 ENCOUNTER — Ambulatory Visit: Payer: BC Managed Care – PPO | Admitting: Behavioral Health

## 2022-04-20 ENCOUNTER — Encounter: Payer: Self-pay | Admitting: Behavioral Health

## 2022-04-20 DIAGNOSIS — F5105 Insomnia due to other mental disorder: Secondary | ICD-10-CM

## 2022-04-20 DIAGNOSIS — F411 Generalized anxiety disorder: Secondary | ICD-10-CM | POA: Diagnosis not present

## 2022-04-20 DIAGNOSIS — F331 Major depressive disorder, recurrent, moderate: Secondary | ICD-10-CM | POA: Diagnosis not present

## 2022-04-20 DIAGNOSIS — F99 Mental disorder, not otherwise specified: Secondary | ICD-10-CM | POA: Diagnosis not present

## 2022-04-20 MED ORDER — BUSPIRONE HCL 30 MG PO TABS: 30.0000 mg | ORAL_TABLET | Freq: Two times a day (BID) | ORAL | 3 refills | Status: DC

## 2022-04-20 MED ORDER — VILAZODONE HCL 40 MG PO TABS: 40.0000 mg | ORAL_TABLET | Freq: Every day | ORAL | 1 refills | Status: DC

## 2022-04-20 NOTE — Progress Notes (Signed)
[facility]  Patient ID: Matthew Novak,  MRN: [id number]  PCP: Elby Showers, MD  Date of Evaluation: 04/20/2022 Time spent:20 minutes  Chief Complaint:  Chief Complaint   Anxiety; Depression; Follow-up; Medication Refill; Medication Problem     HISTORY/CURRENT STATUS: HPI 46  year old male presents to this office for follow up  and medication management. Says that he has had moderate improvement with anxiety but great with depression.He is experiencing a lot of stress due to changes with is job. Business is not doing well financially.  He is requesting dosage increase of Buspar today. Endorses daily Cannabis use mostly at bedtime for anxiety and sleep.  Says his sleep is good right now. He says his anxiety today is 5/10 and depression is 1/10.  He denies mania, no psychosis, no SI/HI.   Past psychiatric medication trial list; Cymbalta  Celexa Wellbutrin Seroquel Effexor Valium Lexapro       Individual Medical History/ Review of Systems: Changes? :No   Allergies: Contrast media [iodinated contrast media]  Current Medications:  Current Outpatient Medications:    busPIRone (BUSPAR) 30 MG tablet, Take 1 tablet (30 mg total) by mouth 2 (two) times daily., Disp: 60 tablet, Rfl: 3   busPIRone (BUSPAR) 15 MG tablet, Take one tablet three times daily, at least 8 hours between doses (total 45 mg daily)., Disp: 90 tablet, Rfl: 3   levothyroxine (SYNTHROID) 50 MCG tablet, TAKE 1 TABLET(50 MCG) BY MOUTH DAILY (Patient taking differently: Take 50 mcg by mouth daily before breakfast.), Disp: 90 tablet, Rfl: 1   QUEtiapine (SEROQUEL) 50 MG tablet, TAKE 1 TABLET(50 MG) BY MOUTH AT BEDTIME (Patient taking differently: Take 50 mg by mouth at bedtime.), Disp: 90 tablet, Rfl: 1   rosuvastatin (CRESTOR) 10 MG tablet, TAKE 1 TABLET(10 MG) BY MOUTH DAILY (Patient taking differently: Take 10 mg by mouth daily.), Disp: 90 tablet, Rfl: 1   Vilazodone HCl (VIIBRYD) 40 MG TABS, Take 1  tablet (40 mg total) by mouth daily., Disp: 90 tablet, Rfl: 1 Medication Side Effects: none  Family Medical/ Social History: Changes? No  MENTAL HEALTH EXAM:  There were no vitals taken for this visit.There is no height or weight on file to calculate BMI.  General Appearance: Casual, Neat, and Well Groomed  Eye Contact:  Good  Speech:  Clear and Coherent  Volume:  Normal  Mood:  Anxious  Affect:  Appropriate  Thought Process:  Coherent  Orientation:  Full (Time, Place, and Person)  Thought Content: Logical   Suicidal Thoughts:  No  Homicidal Thoughts:  No  Memory:  WNL  Judgement:  Good  Insight:  Good  Psychomotor Activity:  Normal  Concentration:  Concentration: Good  Recall:  Good  Fund of Knowledge: Good  Language: Good  Assets:  Desire for Improvement  ADL's:  Intact  Cognition: WNL  Prognosis:  Good    DIAGNOSES:    ICD-10-CM   1. Generalized anxiety disorder  F41.1 busPIRone (BUSPAR) 30 MG tablet    Vilazodone HCl (VIIBRYD) 40 MG TABS    2. Major depressive disorder, recurrent episode, moderate (HCC)  F33.1 busPIRone (BUSPAR) 30 MG tablet    Vilazodone HCl (VIIBRYD) 40 MG TABS    3. Insomnia due to other mental disorder  F51.05    F99       Receiving Psychotherapy: No    RECOMMENDATIONS:  Greater than 50% of 20 min face to face time with patient was spent on counseling and coordination of  care.  No changes since last visit. We discussed his significant level improvement with depression but moderate with anxiety.  Discussed medication alternatives and increase of his Buspar. We agreed to: Continue Viibryd to 40 mg daily Increase Buspar to 30 mg twice daily To continue Seroquel 50 mg at bedtime for sleep Will report side effects or worsening symptoms To follow up in 4 weeks to reassess Provided emergency contact information Discussed potential metabolic side effects associated with atypical antipsychotics, as well as potential risk for movement side  effects. Advised pt to contact office if movement side effects occur.  Reviewed PDMP        Elwanda Brooklyn, NP

## 2022-05-02 DIAGNOSIS — F411 Generalized anxiety disorder: Secondary | ICD-10-CM | POA: Diagnosis not present

## 2022-05-30 DIAGNOSIS — M5412 Radiculopathy, cervical region: Secondary | ICD-10-CM | POA: Diagnosis not present

## 2022-05-30 DIAGNOSIS — M542 Cervicalgia: Secondary | ICD-10-CM | POA: Diagnosis not present

## 2022-06-08 DIAGNOSIS — M5412 Radiculopathy, cervical region: Secondary | ICD-10-CM | POA: Diagnosis not present

## 2022-06-08 DIAGNOSIS — M542 Cervicalgia: Secondary | ICD-10-CM | POA: Diagnosis not present

## 2022-06-20 ENCOUNTER — Encounter: Payer: Self-pay | Admitting: Behavioral Health

## 2022-06-20 ENCOUNTER — Ambulatory Visit (INDEPENDENT_AMBULATORY_CARE_PROVIDER_SITE_OTHER): Payer: BC Managed Care – PPO | Admitting: Behavioral Health

## 2022-06-20 DIAGNOSIS — F331 Major depressive disorder, recurrent, moderate: Secondary | ICD-10-CM | POA: Diagnosis not present

## 2022-06-20 DIAGNOSIS — F99 Mental disorder, not otherwise specified: Secondary | ICD-10-CM

## 2022-06-20 DIAGNOSIS — F5105 Insomnia due to other mental disorder: Secondary | ICD-10-CM

## 2022-06-20 DIAGNOSIS — F411 Generalized anxiety disorder: Secondary | ICD-10-CM | POA: Diagnosis not present

## 2022-06-20 NOTE — Progress Notes (Signed)
[facility]  Patient ID: JAMARIAN JACINTO,  MRN: [id number]  PCP: Elby Showers, MD  Date of Evaluation: 06/20/2022 Time spent:20 minutes  Chief Complaint:  Chief Complaint   Anxiety; Depression; Follow-up; Medication Refill; Patient Education     HISTORY/CURRENT STATUS: HPI 46 year old male presents to this office for follow up  and medication management. Says that he has had moderate improvement with anxiety but great with depression. He believes increasing the Buspar is helping. No medication changes this visit. Endorses daily Cannabis use mostly at bedtime for anxiety and sleep.  Says his sleep is good right now. He says his anxiety today is 4/10 and depression is 1/10.  He denies mania, no psychosis, no SI/HI.   Past psychiatric medication trial list; Cymbalta  Celexa Wellbutrin Seroquel Effexor Valium Lexapro   Individual Medical History/ Review of Systems: Changes? :No   Allergies: Contrast media [iodinated contrast media]  Current Medications:  Current Outpatient Medications:    busPIRone (BUSPAR) 15 MG tablet, Take one tablet three times daily, at least 8 hours between doses (total 45 mg daily)., Disp: 90 tablet, Rfl: 3   busPIRone (BUSPAR) 30 MG tablet, Take 1 tablet (30 mg total) by mouth 2 (two) times daily., Disp: 60 tablet, Rfl: 3   levothyroxine (SYNTHROID) 50 MCG tablet, TAKE 1 TABLET(50 MCG) BY MOUTH DAILY (Patient taking differently: Take 50 mcg by mouth daily before breakfast.), Disp: 90 tablet, Rfl: 1   QUEtiapine (SEROQUEL) 50 MG tablet, TAKE 1 TABLET(50 MG) BY MOUTH AT BEDTIME (Patient taking differently: Take 50 mg by mouth at bedtime.), Disp: 90 tablet, Rfl: 1   rosuvastatin (CRESTOR) 10 MG tablet, TAKE 1 TABLET(10 MG) BY MOUTH DAILY (Patient taking differently: Take 10 mg by mouth daily.), Disp: 90 tablet, Rfl: 1   Vilazodone HCl (VIIBRYD) 40 MG TABS, Take 1 tablet (40 mg total) by mouth daily., Disp: 90 tablet, Rfl: 1 Medication Side  Effects: none  Family Medical/ Social History: Changes? No  MENTAL HEALTH EXAM:  There were no vitals taken for this visit.There is no height or weight on file to calculate BMI.  General Appearance: Casual and Neat  Eye Contact:  Good  Speech:  Clear and Coherent  Volume:  Normal  Mood:  NA  Affect:  Appropriate  Thought Process:  Coherent  Orientation:  Full (Time, Place, and Person)  Thought Content: Logical   Suicidal Thoughts:  No  Homicidal Thoughts:  No  Memory:  WNL  Judgement:  Good  Insight:  Good  Psychomotor Activity:  Normal  Concentration:  Concentration: Good  Recall:  Good  Fund of Knowledge: Good  Language: Good  Assets:  Desire for Improvement  ADL's:  Intact  Cognition: WNL  Prognosis:  Good    DIAGNOSES: No diagnosis found.  Receiving Psychotherapy: NO   RECOMMENDATIONS:   Greater than 50% of 20 min face to face time with patient was spent on counseling and coordination of care. Discussed his significant level improvement with depression but moderate with anxiety.  Discussed medication alternatives and increase of his Buspar. Continue Buspar to 15 mg three times daily To continue Seroquel 50 mg at bedtime for sleep Will report side effects or worsening symptoms To follow up in 3 months to reassess Provided emergency contact information Discussed potential metabolic side effects associated with atypical antipsychotics, as well as potential risk for movement side effects. Advised pt to contact office if movement side effects occur.  Reviewed PDMP  Elwanda Brooklyn, NP

## 2022-06-22 DIAGNOSIS — M542 Cervicalgia: Secondary | ICD-10-CM | POA: Diagnosis not present

## 2022-06-22 DIAGNOSIS — M5412 Radiculopathy, cervical region: Secondary | ICD-10-CM | POA: Diagnosis not present

## 2022-07-14 ENCOUNTER — Other Ambulatory Visit: Payer: BC Managed Care – PPO

## 2022-07-14 DIAGNOSIS — F418 Other specified anxiety disorders: Secondary | ICD-10-CM

## 2022-07-14 DIAGNOSIS — Z125 Encounter for screening for malignant neoplasm of prostate: Secondary | ICD-10-CM

## 2022-07-14 DIAGNOSIS — E039 Hypothyroidism, unspecified: Secondary | ICD-10-CM

## 2022-07-14 DIAGNOSIS — E7800 Pure hypercholesterolemia, unspecified: Secondary | ICD-10-CM | POA: Diagnosis not present

## 2022-07-14 DIAGNOSIS — E782 Mixed hyperlipidemia: Secondary | ICD-10-CM

## 2022-07-15 LAB — CBC WITH DIFFERENTIAL/PLATELET
Absolute Monocytes: 808 cells/uL (ref 200–950)
Basophils Absolute: 60 cells/uL (ref 0–200)
Basophils Relative: 0.7 %
Eosinophils Absolute: 327 cells/uL (ref 15–500)
Eosinophils Relative: 3.8 %
HCT: 43.5 % (ref 38.5–50.0)
Hemoglobin: 15 g/dL (ref 13.2–17.1)
Lymphs Abs: 2434 cells/uL (ref 850–3900)
MCH: 31.7 pg (ref 27.0–33.0)
MCHC: 34.5 g/dL (ref 32.0–36.0)
MCV: 92 fL (ref 80.0–100.0)
MPV: 9.4 fL (ref 7.5–12.5)
Monocytes Relative: 9.4 %
Neutro Abs: 4971 cells/uL (ref 1500–7800)
Neutrophils Relative %: 57.8 %
Platelets: 205 10*3/uL (ref 140–400)
RBC: 4.73 10*6/uL (ref 4.20–5.80)
RDW: 12.4 % (ref 11.0–15.0)
Total Lymphocyte: 28.3 %
WBC: 8.6 10*3/uL (ref 3.8–10.8)

## 2022-07-15 LAB — LIPID PANEL
Cholesterol: 129 mg/dL (ref <200)
HDL: 33 mg/dL — ABNORMAL LOW (ref >=40)
LDL Cholesterol (Calc): 82 mg/dL (calc)
Non-HDL Cholesterol (Calc): 96 mg/dL (calc) (ref <130)
Total CHOL/HDL Ratio: 3.9 (calc) (ref <5.0)
Triglycerides: 68 mg/dL (ref <150)

## 2022-07-15 LAB — COMPLETE METABOLIC PANEL WITH GFR
AG Ratio: 2.4 (calc) (ref 1.0–2.5)
ALT: 14 U/L (ref 9–46)
AST: 25 U/L (ref 10–40)
Albumin: 4.4 g/dL (ref 3.6–5.1)
Alkaline phosphatase (APISO): 70 U/L (ref 36–130)
BUN: 10 mg/dL (ref 7–25)
CO2: 29 mmol/L (ref 20–32)
Calcium: 9.2 mg/dL (ref 8.6–10.3)
Chloride: 107 mmol/L (ref 98–110)
Creat: 0.94 mg/dL (ref 0.60–1.29)
Globulin: 1.8 g/dL (calc) — ABNORMAL LOW (ref 1.9–3.7)
Glucose, Bld: 86 mg/dL (ref 65–99)
Potassium: 4.4 mmol/L (ref 3.5–5.3)
Sodium: 142 mmol/L (ref 135–146)
Total Bilirubin: 0.5 mg/dL (ref 0.2–1.2)
Total Protein: 6.2 g/dL (ref 6.1–8.1)
eGFR: 100 mL/min/{1.73_m2} (ref >=60)

## 2022-07-15 LAB — TSH: TSH: 1.7 mIU/L (ref 0.40–4.50)

## 2022-07-15 LAB — PSA: PSA: 0.41 ng/mL (ref <=4.00)

## 2022-07-18 ENCOUNTER — Other Ambulatory Visit: Payer: Self-pay | Admitting: Internal Medicine

## 2022-07-18 ENCOUNTER — Encounter: Payer: Self-pay | Admitting: Internal Medicine

## 2022-07-18 ENCOUNTER — Ambulatory Visit (INDEPENDENT_AMBULATORY_CARE_PROVIDER_SITE_OTHER): Payer: BC Managed Care – PPO | Admitting: Internal Medicine

## 2022-07-18 VITALS — BP 110/76 | HR 84 | Temp 97.8°F | Ht 70.0 in | Wt 154.1 lb

## 2022-07-18 DIAGNOSIS — Z23 Encounter for immunization: Secondary | ICD-10-CM | POA: Diagnosis not present

## 2022-07-18 DIAGNOSIS — F418 Other specified anxiety disorders: Secondary | ICD-10-CM | POA: Diagnosis not present

## 2022-07-18 DIAGNOSIS — F5101 Primary insomnia: Secondary | ICD-10-CM | POA: Diagnosis not present

## 2022-07-18 DIAGNOSIS — E7800 Pure hypercholesterolemia, unspecified: Secondary | ICD-10-CM

## 2022-07-18 DIAGNOSIS — Z0000 Encounter for general adult medical examination without abnormal findings: Secondary | ICD-10-CM

## 2022-07-18 DIAGNOSIS — M1280 Other specific arthropathies, not elsewhere classified, unspecified site: Secondary | ICD-10-CM | POA: Diagnosis not present

## 2022-07-18 DIAGNOSIS — E039 Hypothyroidism, unspecified: Secondary | ICD-10-CM

## 2022-07-18 LAB — POCT URINALYSIS DIPSTICK
Bilirubin, UA: NEGATIVE
Blood, UA: NEGATIVE
Glucose, UA: NEGATIVE
Ketones, UA: NEGATIVE
Leukocytes, UA: NEGATIVE
Nitrite, UA: NEGATIVE
Protein, UA: NEGATIVE
Spec Grav, UA: 1.015 (ref 1.010–1.025)
Urobilinogen, UA: 0.2 E.U./dL
pH, UA: 5 (ref 5.0–8.0)

## 2022-07-18 NOTE — Patient Instructions (Addendum)
Continue current medications.  Labs are stable.  Vaccines discussed.  Flu vaccine given.  It was a pleasure to see you today.  Return in 1 year or as needed.  LDL mildly elevated.  Watch diet.
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
## 2022-08-05 ENCOUNTER — Emergency Department (HOSPITAL_COMMUNITY)
Admission: EM | Admit: 2022-08-05 | Discharge: 2022-08-05 | Disposition: A | Discharge status: Home / Self Care | Payer: BC Managed Care – PPO | Attending: Emergency Medicine | Admitting: Emergency Medicine

## 2022-08-05 ENCOUNTER — Other Ambulatory Visit: Payer: Self-pay

## 2022-08-05 ENCOUNTER — Emergency Department (HOSPITAL_COMMUNITY): Payer: BC Managed Care – PPO

## 2022-08-05 ENCOUNTER — Encounter (HOSPITAL_COMMUNITY): Payer: Self-pay | Admitting: Emergency Medicine

## 2022-08-05 DIAGNOSIS — Y92094 Garage of other non-institutional residence as the place of occurrence of the external cause: Secondary | ICD-10-CM | POA: Insufficient documentation

## 2022-08-05 DIAGNOSIS — Y93G3 Activity, cooking and baking: Secondary | ICD-10-CM | POA: Insufficient documentation

## 2022-08-05 DIAGNOSIS — W260XXA Contact with knife, initial encounter: Secondary | ICD-10-CM | POA: Diagnosis not present

## 2022-08-05 DIAGNOSIS — S61412A Laceration without foreign body of left hand, initial encounter: Secondary | ICD-10-CM | POA: Diagnosis not present

## 2022-08-05 DIAGNOSIS — S6992XA Unspecified injury of left wrist, hand and finger(s), initial encounter: Secondary | ICD-10-CM | POA: Diagnosis not present

## 2022-08-05 MED ORDER — BACITRACIN ZINC 500 UNIT/GM EX OINT
TOPICAL_OINTMENT | CUTANEOUS | Status: AC
  Administered 2022-08-05: 1
  Filled 2022-08-05: qty 0.9

## 2022-08-05 MED ORDER — LIDOCAINE-EPINEPHRINE (PF) 2 %-1:200000 IJ SOLN
INTRAMUSCULAR | Status: AC
  Filled 2022-08-05: qty 20

## 2022-08-05 NOTE — ED Notes (Signed)
Bacitractin, Non-stick dressing, and roller gauze applied.

## 2022-08-05 NOTE — ED Triage Notes (Signed)
Pt with laceration of left palm with knife about 30 min PTA.  Last tetanus less than 5 years ago per patient.

## 2022-08-05 NOTE — ED Provider Triage Note (Signed)
Emergency Medicine Provider Triage Evaluation Note  Matthew Novak , a 46 y.o. male  was evaluated in triage.  Pt complains of left palm laceration. He accidentally cut it with a knife. He has associated tingling to his fingers. He has no mobility limitations. He is not up to date on tetanus.  Review of Systems  Positive:  Negative:   Physical Exam  BP 130/88 (BP Location: Left Arm)   Pulse 71   Temp 98.3 F (36.8 C) (Oral)   Resp 18   SpO2 99%  Gen:   Awake, no distress   Resp:  Normal effort  MSK:   Moves extremities without difficulty Other:  1 cm linear possibly deep laceration to left palm. No obvious fb or debris. ROM intact of fingers, grip, and wrist.   Medical Decision Making  Medically screening exam initiated at 4:14 PM.  Appropriate orders placed.  Matthew Novak was informed that the remainder of the evaluation will be completed by another provider, this initial triage assessment does not replace that evaluation, and the importance of remaining in the ED until their evaluation is complete.     Matthew Novak, [place] 08/05/22 1615

## 2022-08-05 NOTE — ED Provider Notes (Signed)
[name] DEPT Provider Note   CSN: [id number] Arrival date & time: 08/05/22  1550     History  Chief Complaint  Patient presents with   Laceration    Matthew Novak is a 46 y.o. male presenting with a laceration to the left palm.  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  The knife slipped and cut his palm.  Reports tetanus within the last 5 years.  No numbness or tingling.  Leading was controlled at home however they report it started to "spew" blood upon his arrival   Laceration      Home Medications Prior to Admission medications   Medication Sig Start Date End Date Taking? Authorizing Provider  busPIRone (BUSPAR) 15 MG tablet Take one tablet three times daily, at least 8 hours between doses (total 45 mg daily). 02/23/22   Elwanda Brooklyn, NP  busPIRone (BUSPAR) 30 MG tablet Take 1 tablet (30 mg total) by mouth 2 (two) times daily. 04/20/22   Elwanda Brooklyn, NP  levothyroxine (SYNTHROID) 50 MCG tablet TAKE 1 TABLET(50 MCG) BY MOUTH DAILY Patient taking differently: Take 50 mcg by mouth daily before breakfast. 02/14/22   Elby Showers, MD  QUEtiapine (SEROQUEL) 50 MG tablet TAKE 1 TABLET(50 MG) BY MOUTH AT BEDTIME Patient taking differently: Take 50 mg by mouth at bedtime. 02/14/22   Elby Showers, MD  rosuvastatin (CRESTOR) 10 MG tablet TAKE 1 TABLET(10 MG) BY MOUTH DAILY 07/18/22   Elby Showers, MD  Vilazodone HCl (VIIBRYD) 40 MG TABS Take 1 tablet (40 mg total) by mouth daily. 04/20/22   Elwanda Brooklyn, NP      Allergies    Contrast media [iodinated contrast media]    Review of Systems   Review of Systems  Physical Exam Updated Vital Signs BP 130/88 (BP Location: Left Arm)   Pulse 71   Temp 98.3 F (36.8 C) (Oral)   Resp 18   SpO2 99%  Physical Exam Vitals and nursing note reviewed.  Constitutional:      General: He is not in acute distress.    Appearance: Normal  appearance. He is not ill-appearing.  HENT:     Head: Normocephalic and atraumatic.  Eyes:     General: No scleral icterus.    Conjunctiva/sclera: Conjunctivae normal.  Pulmonary:     Effort: Pulmonary effort is normal. No respiratory distress.  Skin:    General: Skin is warm and dry.     Findings: No rash.     Comments: 2 cm laceration to the left palm.  Jagged in appearance.  Bleeding controlled.  No sensation deficit.  Full range of motion of the wrist, MCPs, PIPs and DIPs.  Normal cap refill in all the digits.  Does not appear to have any tendon, vasculature or nerve involvement  Neurological:     Mental Status: He is alert.  Psychiatric:        Mood and Affect: Mood normal.     ED Results / Procedures / Treatments   Labs (all labs ordered are listed, but only abnormal results are displayed) Labs Reviewed - No data to display  EKG None  Radiology DG Hand Complete Left  Result Date: 08/05/2022 CLINICAL DATA:  Knife injury EXAM: LEFT HAND - COMPLETE 3+ VIEW COMPARISON:  None Available. FINDINGS: No evidence of fracture of the carpal or metacarpal bones. Radiocarpal joint is intact. Phalanges are normal.  No soft tissue injury. No foreign body. IMPRESSION: No fracture or foreign body. Electronically Signed   By: Suzy Bouchard M.D.   On: 08/05/2022 16:43    Procedures .Marland KitchenLaceration Repair  Date/Time: 08/05/2022 8:05 PM  Performed by: Rhae Hammock, PA-C Authorized by: Rhae Hammock, PA-C   Consent:    Consent obtained:  Verbal   Consent given by:  Patient   Risks discussed:  Infection, pain, poor cosmetic result, need for additional repair and retained foreign body Universal protocol:    Imaging studies available: yes     Patient identity confirmed:  Verbally with patient Anesthesia:    Anesthesia method:  Local infiltration   Local anesthetic:  Lidocaine 2% WITH epi Laceration details:    Location:  Hand   Hand location:  L palm   Length (cm):   2 Pre-procedure details:    Preparation:  Patient was prepped and draped in usual sterile fashion Exploration:    Limited defect created (wound extended): no     Hemostasis achieved with:  Direct pressure   Imaging outcome: foreign body not noted     Wound exploration: wound explored through full range of motion and entire depth of wound visualized     Wound extent: no foreign bodies/material noted, no nerve damage noted, no tendon damage noted and no underlying fracture noted     Contaminated: no   Treatment:    Area cleansed with:  Povidone-iodine and saline   Amount of cleaning:  Standard   Irrigation method:  Syringe   Debridement:  None Skin repair:    Repair method:  Sutures   Suture size:  6-0 and 5-0   Suture material:  Prolene   Suture technique:  Simple interrupted   Number of sutures:  5 Approximation:    Approximation:  Close Repair type:    Repair type:  Simple Post-procedure details:    Dressing:  Antibiotic ointment   Procedure completion:  Tolerated well, no immediate complications    Medications Ordered in ED Medications  lidocaine-EPINEPHrine (XYLOCAINE W/EPI) 2 %-1:200000 (PF) injection (has no administration in time range)    ED Course/ Medical Decision Making/ A&P Clinical Course as of 08/05/22 2005  Fri Aug 05, 2022  1858 Called to bedside due to laceration suddenly starting to rapidly ooze. I was able to hold pressure for about 3 minutes with cessation of bleeding. It is nonpulsitile. Lido with epi at bedside  [GL]    Clinical Course User Index [GL] Loeffler, Adora Fridge, PA-C                           Medical Decision Making  46 year old male who presented due to a laceration.  Tetanus up-to-date.  Laceration was repaired with 5 Prolene sutures.  Patient reports that he previously has seen a hand surgeon for a laceration so he would like to see the same person.  Per chart review patient saw Dr. Jeannie Fend.  He will be [facility] to [facility].  Wound  was repaired without complication.  Nursing wrapped it with bacitracin and patient is stable for discharge.  Proper wound care discussed and asked to his discharge papers.  Patient neurovascularly intact.  Without signs of tendon or nerve involvement and will be discharged with hand follow-up.  Final Clinical Impression(s) / ED Diagnoses Final diagnoses:  Laceration of left hand without foreign body, initial encounter    Rx / DC Orders ED Discharge Orders  None      Results and diagnoses were explained to the patient. Return precautions discussed in full. Patient had no additional questions and expressed complete understanding.   This chart was dictated using voice recognition software.  Despite best efforts to proofread,  errors can occur which can change the documentation meaning.    Darliss Ridgel 08/05/22 2018    Ezequiel Essex, MD 08/05/22 2103

## 2022-08-05 NOTE — Discharge Instructions (Addendum)
Have your stitches removed in 7 to 10 days.  You may do this with your primary care doc, urgent care or return to the emergency department.  Read the information about lacerations attached to these discharge papers and look out for signs of infection.  Otherwise, please follow-up with orthopedics.  You saw Dr. Jeannie Fend in the past but you may see anybody else in the office.  Only use mild soap and be sure not to scrub your hands.  You may use bacitracin for any antimicrobial ointment.  It was a pleasure to meet you and we hope you feel better!

## 2022-08-17 DIAGNOSIS — S61412A Laceration without foreign body of left hand, initial encounter: Secondary | ICD-10-CM | POA: Diagnosis not present

## 2022-08-18 ENCOUNTER — Telehealth: Payer: Self-pay | Admitting: Internal Medicine

## 2022-08-18 ENCOUNTER — Other Ambulatory Visit: Payer: Self-pay

## 2022-08-18 MED ORDER — LEVOTHYROXINE SODIUM 50 MCG PO TABS: 50.0000 ug | ORAL_TABLET | Freq: Every day | ORAL | 1 refills | Status: DC

## 2022-08-18 MED ORDER — QUETIAPINE FUMARATE 50 MG PO TABS: 50.0000 mg | ORAL_TABLET | Freq: Every day | ORAL | 1 refills | Status: DC

## 2022-08-18 NOTE — Telephone Encounter (Signed)
Refill sent to pharmacy.   

## 2022-08-18 NOTE — Telephone Encounter (Signed)
Received Fax RX request from  [facility], [address] AT [address]   Phone:[phone]    Medication - QUEtiapine (SEROQUEL) 50 MG tablet  Last Refill - 05/21/2022  Medication - levothyroxine (SYNTHROID) 50 MCG tablet  Last refill - 05/21/2022  Last OV - 07/18/2022  Last CPE - 07/18/2022  Next Appointment - 07/21/2023

## 2022-08-18 NOTE — Addendum Note (Signed)
Addended by: Doran Clay A on: 08/18/2022 03:13 PM   Modules accepted: Orders

## 2022-08-19 DIAGNOSIS — S5402XA Injury of ulnar nerve at forearm level, left arm, initial encounter: Secondary | ICD-10-CM | POA: Diagnosis not present

## 2022-08-19 DIAGNOSIS — S6402XA Injury of ulnar nerve at wrist and hand level of left arm, initial encounter: Secondary | ICD-10-CM | POA: Diagnosis not present

## 2022-08-19 DIAGNOSIS — G8918 Other acute postprocedural pain: Secondary | ICD-10-CM | POA: Diagnosis not present

## 2022-08-19 DIAGNOSIS — X58XXXA Exposure to other specified factors, initial encounter: Secondary | ICD-10-CM | POA: Diagnosis not present

## 2022-08-19 DIAGNOSIS — S65012A Laceration of ulnar artery at wrist and hand level of left arm, initial encounter: Secondary | ICD-10-CM | POA: Diagnosis not present

## 2022-08-19 DIAGNOSIS — S61412A Laceration without foreign body of left hand, initial encounter: Secondary | ICD-10-CM | POA: Diagnosis not present

## 2022-08-19 DIAGNOSIS — S65092A Other specified injury of ulnar artery at wrist and hand level of left arm, initial encounter: Secondary | ICD-10-CM | POA: Diagnosis not present

## 2022-08-19 DIAGNOSIS — Y999 Unspecified external cause status: Secondary | ICD-10-CM | POA: Diagnosis not present

## 2022-08-31 DIAGNOSIS — Z79899 Other long term (current) drug therapy: Secondary | ICD-10-CM | POA: Diagnosis not present

## 2022-08-31 DIAGNOSIS — I7300 Raynaud's syndrome without gangrene: Secondary | ICD-10-CM | POA: Diagnosis not present

## 2022-08-31 DIAGNOSIS — M2550 Pain in unspecified joint: Secondary | ICD-10-CM | POA: Diagnosis not present

## 2022-08-31 DIAGNOSIS — M453 Ankylosing spondylitis of cervicothoracic region: Secondary | ICD-10-CM | POA: Diagnosis not present

## 2022-09-01 DIAGNOSIS — S61412A Laceration without foreign body of left hand, initial encounter: Secondary | ICD-10-CM | POA: Diagnosis not present

## 2022-09-12 DIAGNOSIS — M25511 Pain in right shoulder: Secondary | ICD-10-CM | POA: Diagnosis not present

## 2022-09-12 DIAGNOSIS — M542 Cervicalgia: Secondary | ICD-10-CM | POA: Diagnosis not present

## 2022-09-20 ENCOUNTER — Ambulatory Visit (INDEPENDENT_AMBULATORY_CARE_PROVIDER_SITE_OTHER): Payer: BC Managed Care – PPO | Admitting: Behavioral Health

## 2022-09-20 ENCOUNTER — Encounter: Payer: Self-pay | Admitting: Behavioral Health

## 2022-09-20 DIAGNOSIS — F411 Generalized anxiety disorder: Secondary | ICD-10-CM

## 2022-09-20 DIAGNOSIS — F331 Major depressive disorder, recurrent, moderate: Secondary | ICD-10-CM

## 2022-09-20 MED ORDER — QUETIAPINE FUMARATE 50 MG PO TABS: 50.0000 mg | ORAL_TABLET | Freq: Every day | ORAL | 1 refills | Status: DC

## 2022-09-20 MED ORDER — VILAZODONE HCL 40 MG PO TABS: 40.0000 mg | ORAL_TABLET | Freq: Every day | ORAL | 1 refills | Status: DC

## 2022-09-20 MED ORDER — BUSPIRONE HCL 30 MG PO TABS: 30.0000 mg | ORAL_TABLET | Freq: Two times a day (BID) | ORAL | 1 refills | Status: DC

## 2022-09-20 NOTE — Progress Notes (Signed)
[facility]  Patient ID: Matthew Novak,  MRN: [id number]  PCP: Elby Showers, MD  Date of Evaluation: 09/20/2022 Time spent:30 minutes  Chief Complaint:  Chief Complaint   Anxiety; Depression; Follow-up; Medication Refill; Patient Education     HISTORY/CURRENT STATUS: HPI 46 year old male presents to this office for follow up  and medication management. No social changes this visit. Says that he has had moderate improvement with anxiety but great with depression. He believes increasing the Buspar is helping. No medication changes this visit. Endorses daily Cannabis use mostly at bedtime for anxiety and sleep.  Says his sleep is good right now. He says his anxiety today is 4/10 and depression is 1/10.  He denies mania, no psychosis, no SI/HI.   Past psychiatric medication trial list; Cymbalta  Celexa Wellbutrin Seroquel Effexor Valium Lexapro    Individual Medical History/ Review of Systems: Changes? :No   Allergies: Contrast media [iodinated contrast media]  Current Medications:  Current Outpatient Medications:    busPIRone (BUSPAR) 15 MG tablet, Take one tablet three times daily, at least 8 hours between doses (total 45 mg daily)., Disp: 90 tablet, Rfl: 3   busPIRone (BUSPAR) 30 MG tablet, Take 1 tablet (30 mg total) by mouth 2 (two) times daily., Disp: 180 tablet, Rfl: 1   levothyroxine (SYNTHROID) 50 MCG tablet, Take 1 tablet (50 mcg total) by mouth daily before breakfast., Disp: 90 tablet, Rfl: 1   QUEtiapine (SEROQUEL) 50 MG tablet, Take 1 tablet (50 mg total) by mouth at bedtime., Disp: 90 tablet, Rfl: 1   rosuvastatin (CRESTOR) 10 MG tablet, TAKE 1 TABLET(10 MG) BY MOUTH DAILY, Disp: 90 tablet, Rfl: 1   Vilazodone HCl (VIIBRYD) 40 MG TABS, Take 1 tablet (40 mg total) by mouth daily., Disp: 90 tablet, Rfl: 1 Medication Side Effects: none  Family Medical/ Social History: Changes? No  MENTAL HEALTH EXAM:  There were no vitals taken for this  visit.There is no height or weight on file to calculate BMI.  General Appearance: Casual and Neat  Eye Contact:  Good  Speech:  Clear and Coherent  Volume:  Normal  Mood:  NA  Affect:  Appropriate  Thought Process:  Coherent  Orientation:  Full (Time, Place, and Person)  Thought Content: Logical   Suicidal Thoughts:  No  Homicidal Thoughts:  No  Memory:  WNL  Judgement:  Good  Insight:  Good  Psychomotor Activity:  Normal  Concentration:  Concentration: Good  Recall:  Good  Fund of Knowledge: Good  Language: Good  Assets:  Desire for Improvement  ADL's:  Intact  Cognition: WNL  Prognosis:  Good    DIAGNOSES:    ICD-10-CM   1. Major depressive disorder, recurrent episode, moderate (HCC)  F33.1 Vilazodone HCl (VIIBRYD) 40 MG TABS    QUEtiapine (SEROQUEL) 50 MG tablet    busPIRone (BUSPAR) 30 MG tablet    2. Generalized anxiety disorder  F41.1 Vilazodone HCl (VIIBRYD) 40 MG TABS    QUEtiapine (SEROQUEL) 50 MG tablet    busPIRone (BUSPAR) 30 MG tablet      Receiving Psychotherapy: No    RECOMMENDATIONS:  Greater than 50% of 30  min face to face time with patient was spent on counseling and coordination of care. Discussed his significant level improvement with depression but moderate with anxiety.   Agree to: Continue Buspar to 15 mg three times daily To continue Seroquel 50 mg at bedtime for sleep Continue Vybriid 40 mg daily Will report  side effects or worsening symptoms To follow up in 3 months to reassess Provided emergency contact information Discussed potential metabolic side effects associated with atypical antipsychotics, as well as potential risk for movement side effects. Advised pt to contact office if movement side effects occur.  Reviewed PDMP        Elwanda Brooklyn, NP

## 2022-12-14 DIAGNOSIS — M542 Cervicalgia: Secondary | ICD-10-CM | POA: Diagnosis not present

## 2022-12-14 DIAGNOSIS — M25511 Pain in right shoulder: Secondary | ICD-10-CM | POA: Diagnosis not present

## 2022-12-21 ENCOUNTER — Ambulatory Visit (INDEPENDENT_AMBULATORY_CARE_PROVIDER_SITE_OTHER): Payer: BC Managed Care – PPO | Admitting: Behavioral Health

## 2022-12-21 ENCOUNTER — Encounter: Payer: Self-pay | Admitting: Behavioral Health

## 2022-12-21 DIAGNOSIS — F411 Generalized anxiety disorder: Secondary | ICD-10-CM | POA: Diagnosis not present

## 2022-12-21 DIAGNOSIS — F331 Major depressive disorder, recurrent, moderate: Secondary | ICD-10-CM

## 2022-12-21 MED ORDER — BUSPIRONE HCL 30 MG PO TABS: 30.0000 mg | ORAL_TABLET | Freq: Two times a day (BID) | ORAL | 3 refills | Status: DC

## 2022-12-21 NOTE — Progress Notes (Signed)
[facility]  Patient ID: Matthew Novak,  MRN: [id number]  PCP: Elby Showers, MD  Date of Evaluation: 12/21/2022 Time spent:30 minutes  Chief Complaint:  Chief Complaint   Depression; Follow-up; Medication Problem; Patient Education     HISTORY/CURRENT STATUS: HPI 47 year old male presents to this office for follow up  and medication management. No social changes this visit. Says that he has had moderate improvement with anxiety but great with depression.  Still having some problems with increased anxiety. Requesting adjustment of medications.  Endorses daily Cannabis use mostly at bedtime for anxiety and sleep.  Says his sleep is good right now. He says his anxiety today is 4/10 and depression is 1/10.  He denies mania, no psychosis, no SI/HI.   Past psychiatric medication trial list; Cymbalta  Celexa Wellbutrin Seroquel Effexor Valium Lexapro       Individual Medical History/ Review of Systems: Changes? :No   Allergies: Contrast media [iodinated contrast media]  Current Medications:  Current Outpatient Medications:    busPIRone (BUSPAR) 30 MG tablet, Take 1 tablet (30 mg total) by mouth 2 (two) times daily., Disp: 60 tablet, Rfl: 3   busPIRone (BUSPAR) 30 MG tablet, Take 1 tablet (30 mg total) by mouth 2 (two) times daily., Disp: 180 tablet, Rfl: 1   levothyroxine (SYNTHROID) 50 MCG tablet, Take 1 tablet (50 mcg total) by mouth daily before breakfast., Disp: 90 tablet, Rfl: 1   QUEtiapine (SEROQUEL) 50 MG tablet, Take 1 tablet (50 mg total) by mouth at bedtime., Disp: 90 tablet, Rfl: 1   rosuvastatin (CRESTOR) 10 MG tablet, TAKE 1 TABLET(10 MG) BY MOUTH DAILY, Disp: 90 tablet, Rfl: 1   Vilazodone HCl (VIIBRYD) 40 MG TABS, Take 1 tablet (40 mg total) by mouth daily., Disp: 90 tablet, Rfl: 1 Medication Side Effects: none  Family Medical/ Social History: Changes? No  MENTAL HEALTH EXAM:  There were no vitals taken for this visit.There is no height or  weight on file to calculate BMI.  General Appearance: Casual, Neat, and Well Groomed  Eye Contact:  Good  Speech:  Clear and Coherent  Volume:  Normal  Mood:  Anxious, Depressed, and Dysphoric  Affect:  Appropriate  Thought Process:  Coherent  Orientation:  Full (Time, Place, and Person)  Thought Content: Logical   Suicidal Thoughts:  No  Homicidal Thoughts:  No  Memory:  WNL  Judgement:  Good  Insight:  Good  Psychomotor Activity:  Normal  Concentration:  Concentration: Good  Recall:  Good  Fund of Knowledge: Good  Language: Good  Assets:  Desire for Improvement  ADL's:  Intact  Cognition: WNL  Prognosis:  Good    DIAGNOSES:    ICD-10-CM   1. Major depressive disorder, recurrent episode, moderate (HCC)  F33.1     2. Generalized anxiety disorder  F41.1 busPIRone (BUSPAR) 30 MG tablet      Receiving Psychotherapy: No    RECOMMENDATIONS:  Greater than 50% of 30  min face to face time with patient was spent on counseling and coordination of care. Discussed his significant level improvement with depression but moderate with anxiety.   Agree to: Increase Buspar to 30 mg twice daily To continue Seroquel 50 mg at bedtime for sleep Continue Vybriid 40 mg daily Will report side effects or worsening symptoms To follow up in 3 months to reassess Provided emergency contact information Discussed potential metabolic side effects associated with atypical antipsychotics, as well as potential risk for movement side effects.  Advised pt to contact office if movement side effects occur.  Reviewed PDMP      Elwanda Brooklyn, NP

## 2023-01-16 ENCOUNTER — Other Ambulatory Visit: Payer: Self-pay | Admitting: Internal Medicine

## 2023-02-13 ENCOUNTER — Other Ambulatory Visit: Payer: Self-pay | Admitting: Behavioral Health

## 2023-02-13 ENCOUNTER — Other Ambulatory Visit: Payer: Self-pay | Admitting: Internal Medicine

## 2023-02-13 DIAGNOSIS — F411 Generalized anxiety disorder: Secondary | ICD-10-CM

## 2023-02-13 DIAGNOSIS — F331 Major depressive disorder, recurrent, moderate: Secondary | ICD-10-CM

## 2023-02-20 ENCOUNTER — Ambulatory Visit: Payer: BC Managed Care – PPO | Admitting: Behavioral Health

## 2023-02-21 ENCOUNTER — Encounter: Payer: Self-pay | Admitting: Behavioral Health

## 2023-02-21 ENCOUNTER — Ambulatory Visit: Payer: BC Managed Care – PPO | Admitting: Behavioral Health

## 2023-02-21 DIAGNOSIS — F331 Major depressive disorder, recurrent, moderate: Secondary | ICD-10-CM | POA: Diagnosis not present

## 2023-02-21 DIAGNOSIS — F411 Generalized anxiety disorder: Secondary | ICD-10-CM

## 2023-02-21 MED ORDER — VILAZODONE HCL 40 MG PO TABS
ORAL_TABLET | ORAL | 3 refills | Status: AC
Start: 2023-02-21 — End: ?

## 2023-02-21 MED ORDER — BUSPIRONE HCL 30 MG PO TABS: 30.0000 mg | ORAL_TABLET | Freq: Two times a day (BID) | ORAL | 3 refills | Status: DC

## 2023-02-21 MED ORDER — QUETIAPINE FUMARATE 50 MG PO TABS: 50.0000 mg | ORAL_TABLET | Freq: Every day | ORAL | 1 refills | Status: DC

## 2023-02-21 NOTE — Progress Notes (Signed)
[facility]  Patient ID: MARDELL CRAGG,  MRN: [id number]  PCP: Margaree Mackintosh, MD  Date of Evaluation: 02/21/2023 Time spent:30 minutes  Chief Complaint:  Chief Complaint   Anxiety; Depression; Follow-up; Medication Refill; Patient Education     HISTORY/CURRENT STATUS: HPI 47 year old male presents to this office for follow up  and medication management. No social changes this visit. Says that he has had moderate improvement with anxiety but great with depression. Requesting no adjustment of medications this visit. Having some family stressors.  Endorses daily Cannabis use mostly at bedtime for anxiety and sleep.  Says his sleep is good right now. He says his anxiety today is 3/10 and depression is 1/10.  He denies mania, no psychosis, no SI/HI.   Past psychiatric medication trial list; Cymbalta  Celexa Wellbutrin Seroquel Effexor Valium Lexapro   Individual Medical History/ Review of Systems: Changes? :No   Allergies: Contrast media [iodinated contrast media]  Current Medications:  Current Outpatient Medications:    busPIRone (BUSPAR) 30 MG tablet, Take 1 tablet (30 mg total) by mouth 2 (two) times daily., Disp: 180 tablet, Rfl: 1   busPIRone (BUSPAR) 30 MG tablet, Take 1 tablet (30 mg total) by mouth 2 (two) times daily., Disp: 60 tablet, Rfl: 3   levothyroxine (SYNTHROID) 50 MCG tablet, TAKE 1 TABLET(50 MCG) BY MOUTH DAILY BEFORE BREAKFAST, Disp: 90 tablet, Rfl: 1   QUEtiapine (SEROQUEL) 50 MG tablet, Take 1 tablet (50 mg total) by mouth at bedtime., Disp: 90 tablet, Rfl: 1   rosuvastatin (CRESTOR) 10 MG tablet, TAKE 1 TABLET(10 MG) BY MOUTH DAILY, Disp: 90 tablet, Rfl: 1   Vilazodone HCl (VIIBRYD) 40 MG TABS, TAKE 1 TABLET(40 MG) BY MOUTH DAILY, Disp: 30 tablet, Rfl: 3 Medication Side Effects: none  Family Medical/ Social History: Changes? No  MENTAL HEALTH EXAM:  There were no vitals taken for this visit.There is no height or weight on file to  calculate BMI.  General Appearance: Casual, Neat, and Well Groomed  Eye Contact:  Good  Speech:  Clear and Coherent  Volume:  Normal  Mood:  Anxious and Dysphoric  Affect:  Appropriate  Thought Process:  Coherent  Orientation:  Full (Time, Place, and Person)  Thought Content: Logical   Suicidal Thoughts:  No  Homicidal Thoughts:  No  Memory:  WNL  Judgement:  Good  Insight:  Good  Psychomotor Activity:  Normal  Concentration:  Concentration: Good  Recall:  Good  Fund of Knowledge: Good  Language: Good  Assets:  Desire for Improvement  ADL's:  Intact  Cognition: WNL  Prognosis:  Good    DIAGNOSES:    ICD-10-CM   1. Generalized anxiety disorder  F41.1 busPIRone (BUSPAR) 30 MG tablet    QUEtiapine (SEROQUEL) 50 MG tablet    Vilazodone HCl (VIIBRYD) 40 MG TABS    2. Major depressive disorder, recurrent episode, moderate  F33.1 QUEtiapine (SEROQUEL) 50 MG tablet    Vilazodone HCl (VIIBRYD) 40 MG TABS      Receiving Psychotherapy: No    RECOMMENDATIONS:   Greater than 50% of 30  min face to face time with patient was spent on counseling and coordination of care. Discussed his significant level improvement with depression but moderate with anxiety.   Agree to:  Continue Buspar to 30 mg twice daily To continue Seroquel 50 mg at bedtime for sleep Continue Vybriid 40 mg daily Will report side effects or worsening symptoms To follow up in 3 months to reassess  Provided emergency contact information Discussed potential metabolic side effects associated with atypical antipsychotics, as well as potential risk for movement side effects. Advised pt to contact office if movement side effects occur.  Reviewed PDMP     Joan Flores, NP

## 2023-05-23 ENCOUNTER — Ambulatory Visit: Payer: BC Managed Care – PPO | Admitting: Behavioral Health

## 2023-05-23 ENCOUNTER — Encounter: Payer: Self-pay | Admitting: Behavioral Health

## 2023-05-23 DIAGNOSIS — F411 Generalized anxiety disorder: Secondary | ICD-10-CM | POA: Diagnosis not present

## 2023-05-23 DIAGNOSIS — F331 Major depressive disorder, recurrent, moderate: Secondary | ICD-10-CM | POA: Diagnosis not present

## 2023-05-23 MED ORDER — BUPROPION HCL ER (XL) 150 MG PO TB24
150.0000 mg | ORAL_TABLET | Freq: Every day | ORAL | 1 refills | Status: DC
Start: 2023-05-23 — End: 2023-07-24

## 2023-05-23 NOTE — Progress Notes (Signed)
[facility]  Patient ID: Matthew Novak,  MRN: [id number]  PCP: Margaree Mackintosh, MD  Date of Evaluation: 05/23/2023 Time spent:30 minutes  Chief Complaint:  Chief Complaint   Anxiety; Depression; Follow-up; Medication Problem; Patient Education; Forgetfullness     HISTORY/CURRENT STATUS: HPI 47 year old male presents to this office for follow up  and medication management. No social changes this visit. Says that he has had moderate improvement with anxiety but great with depression. He is having some problems with mental fogginess and forgetfulness. Having some family stressors.  Endorses daily Cannabis use mostly at bedtime for anxiety and sleep.  Says his sleep is good right now. He says his anxiety today is 3/10 and depression is 1/10.  He denies mania, no psychosis, no SI/HI.   Past psychiatric medication trial list; Cymbalta  Celexa Wellbutrin Seroquel Effexor Valium Lexapro Individual Medical History/ Review of Systems: Changes? :No   Allergies: Contrast media [iodinated contrast media]  Current Medications:  Current Outpatient Medications:    buPROPion (WELLBUTRIN XL) 150 MG 24 hr tablet, Take 1 tablet (150 mg total) by mouth daily., Disp: 30 tablet, Rfl: 1   busPIRone (BUSPAR) 30 MG tablet, Take 1 tablet (30 mg total) by mouth 2 (two) times daily., Disp: 180 tablet, Rfl: 1   busPIRone (BUSPAR) 30 MG tablet, Take 1 tablet (30 mg total) by mouth 2 (two) times daily., Disp: 60 tablet, Rfl: 3   levothyroxine (SYNTHROID) 50 MCG tablet, TAKE 1 TABLET(50 MCG) BY MOUTH DAILY BEFORE BREAKFAST, Disp: 90 tablet, Rfl: 1   QUEtiapine (SEROQUEL) 50 MG tablet, Take 1 tablet (50 mg total) by mouth at bedtime., Disp: 90 tablet, Rfl: 1   rosuvastatin (CRESTOR) 10 MG tablet, TAKE 1 TABLET(10 MG) BY MOUTH DAILY, Disp: 90 tablet, Rfl: 1   Vilazodone HCl (VIIBRYD) 40 MG TABS, TAKE 1 TABLET(40 MG) BY MOUTH DAILY, Disp: 30 tablet, Rfl: 3 Medication Side Effects: none  Family  Medical/ Social History: Changes? No  MENTAL HEALTH EXAM:  There were no vitals taken for this visit.There is no height or weight on file to calculate BMI.  General Appearance: Casual  Eye Contact:  Good  Speech:  Clear and Coherent  Volume:  Normal  Mood:  NA  Affect:  Appropriate  Thought Process:  Coherent  Orientation:  Full (Time, Place, and Person)  Thought Content: Logical   Suicidal Thoughts:  No  Homicidal Thoughts:  No  Memory:  WNL  Judgement:  Good  Insight:  Good  Psychomotor Activity:  Normal  Concentration:  Concentration: Good  Recall:  Good  Fund of Knowledge: Good  Language: Good  Assets:  Desire for Improvement  ADL's:  Intact  Cognition: WNL  Prognosis:  Good    DIAGNOSES:    ICD-10-CM   1. Generalized anxiety disorder  F41.1 buPROPion (WELLBUTRIN XL) 150 MG 24 hr tablet    2. Major depressive disorder, recurrent episode, moderate (HCC)  F33.1 buPROPion (WELLBUTRIN XL) 150 MG 24 hr tablet      Receiving Psychotherapy: No    RECOMMENDATIONS:    Greater than 50% of 30  min face to face time with patient was spent on counseling and coordination of care. Discussed his significant level improvement with depression but moderate with anxiety.  Talked about his problems with mental fogginess. He is open to trying another medication to help. Agree to:  Continue Buspar to 30 mg twice daily To continue Seroquel 50 mg at bedtime for sleep Continue Vybriid 40  mg daily To start Wellbutrin 150 mg daily in the am Will report side effects or worsening symptoms To follow up in 6 weeks to reassess Provided emergency contact information Discussed potential metabolic side effects associated with atypical antipsychotics, as well as potential risk for movement side effects. Advised pt to contact office if movement side effects occur.  Reviewed PDMP      Joan Flores, NP

## 2023-06-19 ENCOUNTER — Ambulatory Visit: Payer: BC Managed Care – PPO | Admitting: Behavioral Health

## 2023-06-19 ENCOUNTER — Encounter: Payer: Self-pay | Admitting: Behavioral Health

## 2023-06-19 DIAGNOSIS — F411 Generalized anxiety disorder: Secondary | ICD-10-CM | POA: Diagnosis not present

## 2023-06-19 DIAGNOSIS — F5105 Insomnia due to other mental disorder: Secondary | ICD-10-CM

## 2023-06-19 DIAGNOSIS — F331 Major depressive disorder, recurrent, moderate: Secondary | ICD-10-CM

## 2023-06-19 DIAGNOSIS — F99 Mental disorder, not otherwise specified: Secondary | ICD-10-CM

## 2023-06-19 MED ORDER — BUPROPION HCL ER (XL) 300 MG PO TB24: 300.0000 mg | ORAL_TABLET | Freq: Every day | ORAL | 3 refills | Status: DC

## 2023-06-19 NOTE — Progress Notes (Signed)
[facility]  Patient ID: CHANTE MIDENCE,  MRN: [id number]  PCP: Margaree Mackintosh, MD  Date of Evaluation: 06/19/2023 Time spent:30 minutes  Chief Complaint:  Chief Complaint   Anxiety; Follow-up; Medication Refill; Patient Education     HISTORY/CURRENT STATUS: HPI  48 year old male presents to this office for follow up  and medication management. No social changes this visit. Says that he has had moderate improvement with anxiety but great with depression. Mental fogginess has improved with Wellbutrin but he would like to increase dose to see if it continues to help. Having some family stressors.  Endorses daily Cannabis use mostly at bedtime for anxiety and sleep.  Says his sleep is good right now. He says his anxiety today is 3/10 and depression is 1/10.  He denies mania, no psychosis, no SI/HI.   Past psychiatric medication trial list; Cymbalta  Celexa Wellbutrin Seroquel Effexor Valium Lexapro      Individual Medical History/ Review of Systems: Changes? :No   Allergies: Contrast media [iodinated contrast media]  Current Medications:  Current Outpatient Medications:    buPROPion (WELLBUTRIN XL) 300 MG 24 hr tablet, Take 1 tablet (300 mg total) by mouth daily., Disp: 30 tablet, Rfl: 3   buPROPion (WELLBUTRIN XL) 150 MG 24 hr tablet, Take 1 tablet (150 mg total) by mouth daily., Disp: 30 tablet, Rfl: 1   busPIRone (BUSPAR) 30 MG tablet, Take 1 tablet (30 mg total) by mouth 2 (two) times daily., Disp: 60 tablet, Rfl: 3   levothyroxine (SYNTHROID) 50 MCG tablet, TAKE 1 TABLET(50 MCG) BY MOUTH DAILY BEFORE BREAKFAST, Disp: 90 tablet, Rfl: 1   QUEtiapine (SEROQUEL) 50 MG tablet, Take 1 tablet (50 mg total) by mouth at bedtime., Disp: 90 tablet, Rfl: 1   rosuvastatin (CRESTOR) 10 MG tablet, TAKE 1 TABLET(10 MG) BY MOUTH DAILY, Disp: 90 tablet, Rfl: 1   Vilazodone HCl (VIIBRYD) 40 MG TABS, TAKE 1 TABLET(40 MG) BY MOUTH DAILY, Disp: 30 tablet, Rfl: 3 Medication  Side Effects: none  Family Medical/ Social History: Changes? No  MENTAL HEALTH EXAM:  There were no vitals taken for this visit.There is no height or weight on file to calculate BMI.  General Appearance: Casual, Neat, and Well Groomed  Eye Contact:  Good  Speech:  Clear and Coherent  Volume:  Normal  Mood:  Anxious  Affect:  Appropriate  Thought Process:  Coherent  Orientation:  Full (Time, Place, and Person)  Thought Content: Logical   Suicidal Thoughts:  No  Homicidal Thoughts:  No  Memory:  WNL  Judgement:  Good  Insight:  Good  Psychomotor Activity:  Normal  Concentration:  Concentration: Good  Recall:  Good  Fund of Knowledge: Good  Language: Good  Assets:  Desire for Improvement  ADL's:  Intact  Cognition: WNL  Prognosis:  Good    DIAGNOSES:    ICD-10-CM   1. Generalized anxiety disorder  F41.1     2. Major depressive disorder, recurrent episode, moderate (HCC)  F33.1     3. Insomnia due to other mental disorder  F51.05    F99       Receiving Psychotherapy: No    RECOMMENDATIONS:  Greater than 50% of 30  min face to face time with patient was spent on counseling and coordination of care. Discussed his significant level improvement with depression but moderate with anxiety.  Talked about his problems with mental fogginess. He is open to trying another medication to help. Agree to:  Continue Buspar to 30 mg twice daily To continue Seroquel 50 mg at bedtime for sleep Continue Vybriid 40 mg daily To start Wellbutrin 150 mg daily in the am Will report side effects or worsening symptoms To follow up in 12 weeks to reassess Provided emergency contact information Discussed potential metabolic side effects associated with atypical antipsychotics, as well as potential risk for movement side effects. Advised pt to contact office if movement side effects occur.  Reviewed PDMP       Joan Flores, NP

## 2023-07-08 DIAGNOSIS — H60333 Swimmer's ear, bilateral: Secondary | ICD-10-CM | POA: Diagnosis not present

## 2023-07-08 DIAGNOSIS — H9203 Otalgia, bilateral: Secondary | ICD-10-CM | POA: Diagnosis not present

## 2023-07-17 ENCOUNTER — Other Ambulatory Visit: Payer: BC Managed Care – PPO

## 2023-07-21 ENCOUNTER — Encounter: Payer: BC Managed Care – PPO | Admitting: Internal Medicine

## 2023-07-24 ENCOUNTER — Ambulatory Visit: Payer: BC Managed Care – PPO | Admitting: Internal Medicine

## 2023-07-24 ENCOUNTER — Encounter: Payer: Self-pay | Admitting: Internal Medicine

## 2023-07-24 VITALS — BP 106/70 | HR 65 | Temp 98.4°F | Ht 70.0 in | Wt 150.6 lb

## 2023-07-24 DIAGNOSIS — E039 Hypothyroidism, unspecified: Secondary | ICD-10-CM

## 2023-07-24 DIAGNOSIS — Z0000 Encounter for general adult medical examination without abnormal findings: Secondary | ICD-10-CM | POA: Diagnosis not present

## 2023-07-24 DIAGNOSIS — I7300 Raynaud's syndrome without gangrene: Secondary | ICD-10-CM | POA: Insufficient documentation

## 2023-07-24 DIAGNOSIS — E785 Hyperlipidemia, unspecified: Secondary | ICD-10-CM

## 2023-07-24 DIAGNOSIS — Z23 Encounter for immunization: Secondary | ICD-10-CM | POA: Diagnosis not present

## 2023-07-24 DIAGNOSIS — M453 Ankylosing spondylitis of cervicothoracic region: Secondary | ICD-10-CM | POA: Insufficient documentation

## 2023-07-24 DIAGNOSIS — M4690 Unspecified inflammatory spondylopathy, site unspecified: Secondary | ICD-10-CM | POA: Insufficient documentation

## 2023-07-24 LAB — CBC WITH DIFFERENTIAL/PLATELET
Basophils Absolute: 0.1 10*3/uL (ref 0.0–0.1)
Basophils Relative: 0.9 % (ref 0.0–3.0)
Eosinophils Absolute: 0.4 10*3/uL (ref 0.0–0.7)
Eosinophils Relative: 6.1 % — ABNORMAL HIGH (ref 0.0–5.0)
HCT: 46.9 % (ref 39.0–52.0)
Hemoglobin: 15.6 g/dL (ref 13.0–17.0)
Lymphocytes Relative: 29.2 % (ref 12.0–46.0)
Lymphs Abs: 2.2 10*3/uL (ref 0.7–4.0)
MCHC: 33.2 g/dL (ref 30.0–36.0)
MCV: 93.6 fl (ref 78.0–100.0)
Monocytes Absolute: 0.6 10*3/uL (ref 0.1–1.0)
Monocytes Relative: 8.8 % (ref 3.0–12.0)
Neutro Abs: 4.1 10*3/uL (ref 1.4–7.7)
Neutrophils Relative %: 55 % (ref 43.0–77.0)
Platelets: 192 10*3/uL (ref 150.0–400.0)
RBC: 5.01 Mil/uL (ref 4.22–5.81)
RDW: 13.2 % (ref 11.5–15.5)
WBC: 7.4 10*3/uL (ref 4.0–10.5)

## 2023-07-24 LAB — TSH: TSH: 1.52 u[IU]/mL (ref 0.35–5.50)

## 2023-07-24 LAB — LIPID PANEL
Cholesterol: 116 mg/dL (ref 0–200)
HDL: 37.1 mg/dL — ABNORMAL LOW (ref >39.00)
LDL Cholesterol: 66 mg/dL (ref 0–99)
NonHDL: 79.37
Total CHOL/HDL Ratio: 3
Triglycerides: 68 mg/dL (ref 0.0–149.0)
VLDL: 13.6 mg/dL (ref 0.0–40.0)

## 2023-07-24 LAB — COMPREHENSIVE METABOLIC PANEL
ALT: 16 U/L (ref 0–53)
AST: 25 U/L (ref 0–37)
Albumin: 4.4 g/dL (ref 3.5–5.2)
Alkaline Phosphatase: 65 U/L (ref 39–117)
BUN: 15 mg/dL (ref 6–23)
CO2: 28 meq/L (ref 19–32)
Calcium: 9.4 mg/dL (ref 8.4–10.5)
Chloride: 104 meq/L (ref 96–112)
Creatinine, Ser: 0.99 mg/dL (ref 0.40–1.50)
GFR: 89.44 mL/min (ref >60.00)
Glucose, Bld: 85 mg/dL (ref 70–99)
Potassium: 4.7 meq/L (ref 3.5–5.1)
Sodium: 140 meq/L (ref 135–145)
Total Bilirubin: 0.4 mg/dL (ref 0.2–1.2)
Total Protein: 6.5 g/dL (ref 6.0–8.3)

## 2023-07-24 MED ORDER — LEVOTHYROXINE SODIUM 50 MCG PO TABS
50.0000 ug | ORAL_TABLET | Freq: Every day | ORAL | 1 refills | Status: DC
Start: 2023-07-24 — End: 2024-02-21

## 2023-07-24 MED ORDER — ROSUVASTATIN CALCIUM 10 MG PO TABS: 10.0000 mg | ORAL_TABLET | Freq: Every day | ORAL | 1 refills | Status: DC

## 2023-07-24 NOTE — Progress Notes (Signed)
Subjective:   Matthew Novak 1976/01/02 07/24/2023  CC: Chief Complaint  Patient presents with   Transitions Of Care    Med refilled     HPI: Matthew Novak is a 47 y.o. male who presents to establish care and for a routine health maintenance exam.  Labs collected at time of visit. He is fasting.  He needs refills of levothyroxine and crestor.   HEALTH SCREENINGS: - PSA (50+): Not applicable   Lab Results  Component Value Date   PSA 0.41 07/14/2022   PSA 0.52 04/14/2016     - Colonoscopy (45+): Up to date  - AAA Screening: Not applicable  Men age 30-75 who have ever smoked - Lung Cancer screening with low-dose CT: Not applicable-  Adults age 51-80 who are current cigarette smokers or quit within the last 15 years. Must have 20 pack year history.   Depression and Anxiety Screen done today and results listed below:     07/24/2023    9:32 AM 07/18/2022   10:58 AM 12/14/2021    2:28 PM 10/21/2021   12:08 PM 07/15/2021   11:33 AM  Depression screen PHQ 2/9  Decreased Interest 3 3  3 3   Down, Depressed, Hopeless 2 3  3 3   PHQ - 2 Score 5 6  6 6   Altered sleeping 0 1  3 0  Tired, decreased energy 3 1  3 3   Change in appetite 3 1  3 3   Feeling bad or failure about yourself  1 0  1 2  Trouble concentrating 3 1  3 1   Moving slowly or fidgety/restless 0 0  0 0  Suicidal thoughts 0 0  0 0  PHQ-9 Score 15 10  19 15   Difficult doing work/chores Not difficult at all Not difficult at all  Somewhat difficult Not difficult at all     Information is confidential and restricted. Go to Review Flowsheets to unlock data.      07/24/2023    9:32 AM  GAD 7 : Generalized Anxiety Score  Nervous, Anxious, on Edge 3  Control/stop worrying 3  Worry too much - different things 3  Trouble relaxing 3  Restless 3  Easily annoyed or irritable 1  Afraid - awful might happen 3  Total GAD 7 Score 19  Anxiety Difficulty Not difficult at all    IMMUNIZATIONS:  - Tdap: Tetanus  vaccination status reviewed: last tetanus booster within 10 years. - Influenza: Administered today - Pneumovax: Not applicable - Prevnar: Not applicable - Zostavax vaccine (50+): Not applicable   Past medical history, surgical history, medications, allergies, family history and social history reviewed with patient today and changes made to appropriate areas of the chart.   Past Medical History:  Diagnosis Date   Allergy    Anxiety    Arthritis    Depression    Hyperlipidemia    Hypertension    stop drinking ETOH- no longer needs meds or has high BP   Hypothyroidism    Substance abuse (HCC)     Past Surgical History:  Procedure Laterality Date   CARPAL TUNNEL WITH CUBITAL TUNNEL Right    CERVICAL DISC ARTHROPLASTY     COLONOSCOPY WITH PROPOFOL N/A 02/24/2022   Procedure: COLONOSCOPY WITH PROPOFOL;  Surgeon: Meryl Dare, MD;  Location: [facility];  Service: Endoscopy;  Laterality: N/A;   FEMORAL ARTERY EXPLORATION Right 05/02/2016   Procedure: RIGHT GROIN EVACUATION HEMATOMA WITH BYPASS OF RIGHT SUPERFICIAL FEMORAL ARTERY USING SAPHENOUS  VEIN;  Surgeon: Larina Earthly, MD;  Location: [facility];  Service: Vascular;  Laterality: Right;   GRAFT APPLICATION Left 09/09/2019   Procedure: INTEGRA GRAFT APPLICATION;  Surgeon: Ernest Mallick, MD;  Location: [facility];  Service: Orthopedics;  Laterality: Left;   INCISION AND DRAINAGE OF WOUND Left 09/09/2019   Procedure: Left thumb irrigation and debridement with integra application and surgery as indicated;  Surgeon: Ernest Mallick, MD;  Location: [facility];  Service: Orthopedics;  Laterality: Left;    MOUTH SURGERY     POLYPECTOMY  02/24/2022   Procedure: POLYPECTOMY;  Surgeon: Meryl Dare, MD;  Location: [facility];  Service: Endoscopy;;   ulnar nerve Right     Current Outpatient Medications on File Prior to Visit  Medication Sig   buPROPion (WELLBUTRIN XL) 300 MG 24 hr tablet  Take 1 tablet (300 mg total) by mouth daily.   busPIRone (BUSPAR) 30 MG tablet Take 1 tablet (30 mg total) by mouth 2 (two) times daily.   QUEtiapine (SEROQUEL) 50 MG tablet Take 1 tablet (50 mg total) by mouth at bedtime.   Vilazodone HCl (VIIBRYD) 40 MG TABS TAKE 1 TABLET(40 MG) BY MOUTH DAILY   diclofenac (VOLTAREN) 75 MG EC tablet Take 75 mg by mouth 2 (two) times daily.   No current facility-administered medications on file prior to visit.    Allergies  Allergen Reactions   Contrast Media [Iodinated Contrast Media] Hives     Social History   Socioeconomic History   Marital status: Married    Spouse name: Neysa Bonito   Number of children: 1   Years of education: 12   Highest education level: Associate degree: occupational, Scientist, product/process development, or vocational program  Occupational History   Occupation: Merchandiser, retail  Tobacco Use   Smoking status: Every Day    Current packs/day: 0.50    Average packs/day: 0.5 packs/day for 26.0 years (13.0 ttl pk-yrs)    Types: Cigarettes   Smokeless tobacco: Never  Vaping Use   Vaping status: Some Days  Substance and Sexual Activity   Alcohol use: Not Currently    Alcohol/week: 0.0 standard drinks of alcohol    Comment: 1 year sober   Drug use: Yes    Types: Marijuana    Comment: daily   Sexual activity: Yes  Other Topics Concern   Not on file  Social History Narrative   Lives in [place] with wife and 14 year old.    Social Determinants of Health   Financial Resource Strain: Low Risk  (07/22/2023)   Overall Financial Resource Strain (CARDIA)    Difficulty of Paying Living Expenses: Not very hard  Food Insecurity: No Food Insecurity (07/22/2023)   Hunger Vital Sign    Worried About Running Out of Food in the Last Year: Never true    Ran Out of Food in the Last Year: Never true  Transportation Needs: No Transportation Needs (07/22/2023)   PRAPARE - Administrator, Civil Service (Medical): No    Lack of Transportation  (Non-Medical): No  Physical Activity: Unknown (07/22/2023)   Exercise Vital Sign    Days of Exercise per Week: 6 days    Minutes of Exercise per Session: Patient declined  Stress: Stress Concern Present (07/22/2023)   [employer] of Occupational Health - Occupational Stress Questionnaire    Feeling of Stress : To some extent  Social Connections: Unknown (07/22/2023)   Social Connection and Isolation Panel [NHANES]  Frequency of Communication with Friends and Family: Once a week    Frequency of Social Gatherings with Friends and Family: Patient declined    Attends Religious Services: Never    Database administrator or Organizations: No    Attends Engineer, structural: Not on file    Marital Status: Married  Catering manager Violence: Not on file   Social History   Tobacco Use  Smoking Status Every Day   Current packs/day: 0.50   Average packs/day: 0.5 packs/day for 26.0 years (13.0 ttl pk-yrs)   Types: Cigarettes  Smokeless Tobacco Never   Social History   Substance and Sexual Activity  Alcohol Use Not Currently   Alcohol/week: 0.0 standard drinks of alcohol   Comment: 1 year sober     Family History  Problem Relation Age of Onset   Depression Mother    Anxiety disorder Mother    Hypertension Mother    Alcohol abuse Father    Depression Father    Anxiety disorder Father    Heart disease Father    Hypertension Father    Alcohol abuse Brother    Depression Brother    Anxiety disorder Brother    Hypertension Brother    Colon cancer Neg Hx    Esophageal cancer Neg Hx    Rectal cancer Neg Hx    Stomach cancer Neg Hx      ROS: Denies fever, fatigue, unexplained weight loss/gain, hearing or vision changes, cardiac or respiratory complaints. Denies neurological deficits, musculoskeletal complaints, gastrointestinal or genitourinary complaints, mental health complaints, and skin changes.   Objective:   Today's Vitals   07/24/23 0928  BP: 106/70   Pulse: 65  Temp: 98.4 F (36.9 C)  TempSrc: Temporal  SpO2: 99%  Weight: 150 lb 9.6 oz (68.3 kg)  Height: 5\' 10"  (1.778 m)    GENERAL APPEARANCE: Well-appearing, in NAD. Well nourished.  SKIN: Pink, warm and dry. Turgor normal. No rash, lesion, ulceration, or ecchymoses. Hair evenly distributed.  HEENT: HEAD: Normocephalic.  EYES: PERRLA. EOMI. Lids intact w/o defect. Sclera white, Conjunctiva pink w/o exudate.  EARS: External ear w/o redness, swelling, masses or lesions. EAC clear. TM's intact, translucent w/o bulging, appropriate landmarks visualized. Appropriate acuity to conversational tones.  NOSE: Septum midline w/o deformity. Nares patent, mucosa pink and non-inflamed w/o drainage. No sinus tenderness.  THROAT: Uvula midline. Oropharynx clear. Tonsils non-inflamed w/o exudate . Oral mucosa pink and moist.  NECK: Supple, Trachea midline. Full ROM w/o pain or tenderness. No lymphadenopathy. Thyroid non-tender w/o enlargement or palpable masses.  RESPIRATORY: Chest wall symmetrical w/o masses. Respirations even and non-labored. Breath sounds clear to auscultation bilaterally. No wheezes, rales, rhonchi, or crackles. CARDIAC: S1, S2 present, regular rate and rhythm. No gallops, murmurs, rubs, or clicks. No carotid bruits. Capillary refill <2 seconds. Peripheral pulses 2+ bilaterally. GI: Abdomen soft w/o distention. Normoactive bowel sounds. No palpable masses or tenderness. No guarding or rebound tenderness. Liver and spleen w/o tenderness or enlargement. No CVA tenderness.  GU:  deferred exam. MSK: Muscle tone and strength appropriate for age, w/o atrophy or abnormal movement. EXTREMITIES: Active ROM intact, w/o tenderness, crepitus, or contracture. No obvious joint deformities or effusions. No clubbing, edema, or cyanosis.  NEUROLOGIC: CN's II-XII intact. Motor strength symmetrical with no obvious weakness. No sensory deficits.  Steady, even gait.  PSYCH/MENTAL STATUS: Alert,  oriented x 3. Cooperative, appropriate mood and affect.    Assessment & Plan:  1. Encounter for general adult medical examination without abnormal  findings - CBC with Differential/Platelet - Comprehensive metabolic panel - Lipid panel - TSH  2. Need for influenza vaccination - Flu vaccine trivalent PF, 6mos and older(Flulaval,Afluria,Fluarix,Fluzone)  3. Hypothyroidism, unspecified type - levothyroxine (SYNTHROID) 50 MCG tablet; Take 1 tablet (50 mcg total) by mouth daily before breakfast.  Dispense: 90 tablet; Refill: 1  4. Hyperlipidemia, unspecified hyperlipidemia type - rosuvastatin (CRESTOR) 10 MG tablet; Take 1 tablet (10 mg total) by mouth daily.  Dispense: 90 tablet; Refill: 1   Orders Placed This Encounter  Procedures   Flu vaccine trivalent PF, 6mos and older(Flulaval,Afluria,Fluarix,Fluzone)   CBC with Differential/Platelet   Comprehensive metabolic panel   Lipid panel   TSH    PATIENT COUNSELING: - Encouraged to adjust caloric intake to maintain or achieve ideal body weight, to reduce intake of dietary saturated fat and total fat, to limit sodium intake by avoiding high sodium foods and not adding table salt, and to maintain adequate dietary potassium and calcium preferably from fresh fruits, vegetables, and low-fat dairy products.   - Advised to avoid cigarette smoking. - Stressed the importance of regular exercise - Injury prevention: Discussed safety belts - Dental health:  importance of regular tooth brushing, flossing, and dental visits.   NEXT PREVENTATIVE PHYSICAL DUE IN 1 YEAR.  Return in about 1 year (around 07/23/2024) for Fasting Annual Physical Exam, and as needed. Salvatore Decent, FNP

## 2023-08-31 DIAGNOSIS — I7300 Raynaud's syndrome without gangrene: Secondary | ICD-10-CM | POA: Diagnosis not present

## 2023-08-31 DIAGNOSIS — M542 Cervicalgia: Secondary | ICD-10-CM | POA: Diagnosis not present

## 2023-08-31 DIAGNOSIS — M79641 Pain in right hand: Secondary | ICD-10-CM | POA: Diagnosis not present

## 2023-08-31 DIAGNOSIS — Z6822 Body mass index (BMI) 22.0-22.9, adult: Secondary | ICD-10-CM | POA: Diagnosis not present

## 2023-08-31 DIAGNOSIS — M79642 Pain in left hand: Secondary | ICD-10-CM | POA: Diagnosis not present

## 2023-08-31 DIAGNOSIS — Z79899 Other long term (current) drug therapy: Secondary | ICD-10-CM | POA: Diagnosis not present

## 2023-08-31 DIAGNOSIS — M453 Ankylosing spondylitis of cervicothoracic region: Secondary | ICD-10-CM | POA: Diagnosis not present

## 2023-09-19 ENCOUNTER — Ambulatory Visit: Payer: BC Managed Care – PPO | Admitting: Behavioral Health

## 2023-09-21 ENCOUNTER — Ambulatory Visit: Payer: BC Managed Care – PPO | Admitting: Behavioral Health

## 2023-09-21 ENCOUNTER — Encounter: Payer: Self-pay | Admitting: Behavioral Health

## 2023-09-21 DIAGNOSIS — F330 Major depressive disorder, recurrent, mild: Secondary | ICD-10-CM

## 2023-09-21 DIAGNOSIS — F331 Major depressive disorder, recurrent, moderate: Secondary | ICD-10-CM

## 2023-09-21 DIAGNOSIS — F411 Generalized anxiety disorder: Secondary | ICD-10-CM

## 2023-09-21 MED ORDER — QUETIAPINE FUMARATE 50 MG PO TABS: 50.0000 mg | ORAL_TABLET | Freq: Every day | ORAL | 1 refills | Status: DC

## 2023-09-21 MED ORDER — BUSPIRONE HCL 30 MG PO TABS: 30.0000 mg | ORAL_TABLET | Freq: Two times a day (BID) | ORAL | 3 refills | Status: DC

## 2023-09-21 MED ORDER — BUPROPION HCL ER (XL) 300 MG PO TB24: 300.0000 mg | ORAL_TABLET | Freq: Every day | ORAL | 1 refills | Status: DC

## 2023-09-21 MED ORDER — VILAZODONE HCL 40 MG PO TABS: 40.0000 mg | ORAL_TABLET | Freq: Every day | ORAL | 1 refills | Status: DC

## 2023-09-21 NOTE — Progress Notes (Signed)
[facility]  Patient ID: Matthew Novak,  MRN: [id number]  PCP: Salvatore Decent, FNP  Date of Evaluation: 09/21/2023 Time spent:30 minutes  Chief Complaint:  Chief Complaint   Anxiety; Depression; Follow-up; Medication Refill; Patient Education     HISTORY/CURRENT STATUS: HPI  47 year old male presents to this office for follow up  and medication management. No social changes this visit. Says that he has had moderate improvement with anxiety but great with depression. Mental fogginess has improved with Wellbutrin but he would like to increase dose to see if it continues to help. Having some family stressors.  Endorses daily Cannabis use mostly at bedtime for anxiety and sleep.  Says his sleep is good right now. He says his anxiety today is 3/10 and depression is 1/10.  He denies mania, no psychosis, no SI/HI.   Past psychiatric medication trial list; Cymbalta  Celexa Wellbutrin Seroquel Effexor Valium Lexapro      Individual Medical History/ Review of Systems: Changes? :No   Allergies: Contrast media [iodinated contrast media]  Current Medications:  Current Outpatient Medications:    buPROPion (WELLBUTRIN XL) 300 MG 24 hr tablet, Take 1 tablet (300 mg total) by mouth daily., Disp: 90 tablet, Rfl: 1   Vilazodone HCl (VIIBRYD) 40 MG TABS, Take 1 tablet (40 mg total) by mouth daily., Disp: 90 tablet, Rfl: 1   buPROPion (WELLBUTRIN XL) 300 MG 24 hr tablet, Take 1 tablet (300 mg total) by mouth daily., Disp: 30 tablet, Rfl: 3   busPIRone (BUSPAR) 30 MG tablet, Take 1 tablet (30 mg total) by mouth 2 (two) times daily., Disp: 180 tablet, Rfl: 3   diclofenac (VOLTAREN) 75 MG EC tablet, Take 75 mg by mouth 2 (two) times daily., Disp: , Rfl:    levothyroxine (SYNTHROID) 50 MCG tablet, Take 1 tablet (50 mcg total) by mouth daily before breakfast., Disp: 90 tablet, Rfl: 1   QUEtiapine (SEROQUEL) 50 MG tablet, Take 1 tablet (50 mg total) by mouth at bedtime., Disp: 90  tablet, Rfl: 1   rosuvastatin (CRESTOR) 10 MG tablet, Take 1 tablet (10 mg total) by mouth daily., Disp: 90 tablet, Rfl: 1   Vilazodone HCl (VIIBRYD) 40 MG TABS, TAKE 1 TABLET(40 MG) BY MOUTH DAILY, Disp: 30 tablet, Rfl: 3 Medication Side Effects: none  Family Medical/ Social History: Changes? No  MENTAL HEALTH EXAM:  There were no vitals taken for this visit.There is no height or weight on file to calculate BMI.  General Appearance: Casual, Neat, and Well Groomed  Eye Contact:  Good  Speech:  Clear and Coherent  Volume:  Normal  Mood:  NA  Affect:  Appropriate  Thought Process:  Coherent  Orientation:  Full (Time, Place, and Person)  Thought Content: Logical   Suicidal Thoughts:  No  Homicidal Thoughts:  No  Memory:  WNL  Judgement:  Good  Insight:  Good  Psychomotor Activity:  Normal  Concentration:  Concentration: Good  Recall:  Good  Fund of Knowledge: Good  Language: Good  Assets:  Desire for Improvement  ADL's:  Intact  Cognition: WNL  Prognosis:  Good    DIAGNOSES:    ICD-10-CM   1. Generalized anxiety disorder  F41.1 busPIRone (BUSPAR) 30 MG tablet    QUEtiapine (SEROQUEL) 50 MG tablet    Vilazodone HCl (VIIBRYD) 40 MG TABS    buPROPion (WELLBUTRIN XL) 300 MG 24 hr tablet    2. Mild episode of recurrent major depressive disorder (HCC)  F33.0 Vilazodone HCl (VIIBRYD)  40 MG TABS    buPROPion (WELLBUTRIN XL) 300 MG 24 hr tablet    3. Major depressive disorder, recurrent episode, moderate (HCC)  F33.1 QUEtiapine (SEROQUEL) 50 MG tablet      Receiving Psychotherapy: No    RECOMMENDATIONS:   Greater than 50% of 30  min face to face time with patient was spent on counseling and coordination of care. Discussed his significant level improvement with depression but moderate with anxiety.  Talked about his problems with mental fogginess. He is open to trying another medication to help. Agree to:  Continue Buspar to 30 mg twice daily To continue Seroquel 50 mg at  bedtime for sleep Continue Vybriid 40 mg daily Continue  Wellbutrin 300 mg daily in the am Will report side effects or worsening symptoms To follow up in 12 weeks to reassess Provided emergency contact information Discussed potential metabolic side effects associated with atypical antipsychotics, as well as potential risk for movement side effects. Advised pt to contact office if movement side effects occur.  Reviewed PDMP      Joan Flores, NP

## 2023-09-22 ENCOUNTER — Ambulatory Visit: Payer: BC Managed Care – PPO | Admitting: Behavioral Health

## 2023-12-21 ENCOUNTER — Ambulatory Visit: Payer: BC Managed Care – PPO | Admitting: Behavioral Health

## 2023-12-23 ENCOUNTER — Other Ambulatory Visit: Payer: Self-pay | Admitting: Behavioral Health

## 2023-12-23 DIAGNOSIS — F411 Generalized anxiety disorder: Secondary | ICD-10-CM

## 2023-12-23 DIAGNOSIS — F331 Major depressive disorder, recurrent, moderate: Secondary | ICD-10-CM

## 2024-01-21 ENCOUNTER — Other Ambulatory Visit: Payer: Self-pay | Admitting: Internal Medicine

## 2024-01-21 DIAGNOSIS — E785 Hyperlipidemia, unspecified: Secondary | ICD-10-CM

## 2024-02-21 ENCOUNTER — Ambulatory Visit: Admitting: Internal Medicine

## 2024-02-21 ENCOUNTER — Encounter: Payer: Self-pay | Admitting: Internal Medicine

## 2024-02-21 DIAGNOSIS — E039 Hypothyroidism, unspecified: Secondary | ICD-10-CM

## 2024-02-21 MED ORDER — LEVOTHYROXINE SODIUM 50 MCG PO TABS: 50.0000 ug | ORAL_TABLET | Freq: Every day | ORAL | 3 refills | Status: AC

## 2024-02-21 NOTE — Progress Notes (Signed)
                Gavin Kast, FNP

## 2024-02-21 NOTE — Progress Notes (Signed)
 [facility] [address] [place] [address] Dept: [phone] Dept Fax: [phone]  Acute Care Office Visit  Subjective:   Matthew Novak 10/25/1976 02/21/2024  Chief Complaint  Patient presents with   Medication Refill    HPI: HYPOTHYROIDISM: Patient presents for the medical management of Hypothyroidism. Current medication: Levothyroxine PO daily  Patient compliant with medication regimen: yes  Fatigue: no Cold intolerance: no Weight gain: no Lower extremity edema: no Lab Results  Component Value Date   TSH 1.52 07/24/2023     The following portions of the patient's history were reviewed and updated as appropriate: past medical history, past surgical history, family history, social history, allergies, medications, and problem list.   Patient Active Problem List   Diagnosis Date Noted   Ankylosing spondylitis of cervicothoracic region (HCC) 07/24/2023   Unspecified inflammatory spondylopathy, site unspecified (HCC) 07/24/2023   Raynaud's disease 07/24/2023   Laceration of left hand 08/17/2022   Special screening for malignant neoplasms, colon    Benign neoplasm of transverse colon    Benign neoplasm of sigmoid colon    Benign neoplasm of rectum    HLA-B27 positive arthropathy 07/08/2019   Bilateral hand pain 12/08/2014   Hyperlipidemia 09/01/2014   Hypothyroidism 03/03/2014   Insomnia 12/16/2013   Low testosterone 03/09/2013   Recovering alcoholic in remission (HCC) 03/09/2013   Anxiety 09/12/2011   Depression 03/29/2011   Past Medical History:  Diagnosis Date   Allergy    Anxiety    Arthritis    Depression    Hyperlipidemia    Hypertension    stop drinking ETOH- no longer needs meds or has high BP   Hypothyroidism    Substance abuse (HCC)    Past Surgical History:  Procedure Laterality Date   CARPAL TUNNEL WITH CUBITAL TUNNEL Right    CERVICAL DISC ARTHROPLASTY     COLONOSCOPY  WITH PROPOFOL N/A 02/24/2022   Procedure: COLONOSCOPY WITH PROPOFOL;  Surgeon: Meryl Dare, MD;  Location: [facility];  Service: Endoscopy;  Laterality: N/A;   FEMORAL ARTERY EXPLORATION Right 05/02/2016   Procedure: RIGHT GROIN EVACUATION HEMATOMA WITH BYPASS OF RIGHT SUPERFICIAL FEMORAL ARTERY USING SAPHENOUS VEIN;  Surgeon: Larina Earthly, MD;  Location: [facility];  Service: Vascular;  Laterality: Right;   GRAFT APPLICATION Left 09/09/2019   Procedure: INTEGRA GRAFT APPLICATION;  Surgeon: Ernest Mallick, MD;  Location: [facility];  Service: Orthopedics;  Laterality: Left;   INCISION AND DRAINAGE OF WOUND Left 09/09/2019   Procedure: Left thumb irrigation and debridement with integra application and surgery as indicated;  Surgeon: Ernest Mallick, MD;  Location: [facility];  Service: Orthopedics;  Laterality: Left;    MOUTH SURGERY     POLYPECTOMY  02/24/2022   Procedure: POLYPECTOMY;  Surgeon: Meryl Dare, MD;  Location: [facility];  Service: Endoscopy;;   ulnar nerve Right    Family History  Problem Relation Age of Onset   Depression Mother    Anxiety disorder Mother    Hypertension Mother    Alcohol abuse Father    Depression Father    Anxiety disorder Father    Heart disease Father    Hypertension Father    Alcohol abuse Brother    Depression Brother    Anxiety disorder Brother    Hypertension Brother    Colon cancer Neg Hx    Esophageal cancer Neg Hx    Rectal cancer Neg Hx  Stomach cancer Neg Hx     Current Outpatient Medications:    buPROPion (WELLBUTRIN XL) 300 MG 24 hr tablet, Take 1 tablet (300 mg total) by mouth daily., Disp: 30 tablet, Rfl: 3   busPIRone (BUSPAR) 30 MG tablet, Take 1 tablet (30 mg total) by mouth 2 (two) times daily., Disp: 180 tablet, Rfl: 3   QUEtiapine (SEROQUEL) 50 MG tablet, TAKE 1 TABLET(50 MG) BY MOUTH AT BEDTIME, Disp: 30 tablet, Rfl: 0   rosuvastatin (CRESTOR) 10 MG tablet, TAKE 1  TABLET(10 MG) BY MOUTH DAILY, Disp: 90 tablet, Rfl: 1   Vilazodone HCl (VIIBRYD) 40 MG TABS, TAKE 1 TABLET(40 MG) BY MOUTH DAILY, Disp: 30 tablet, Rfl: 3   diclofenac (VOLTAREN) 75 MG EC tablet, Take 75 mg by mouth 2 (two) times daily., Disp: , Rfl:    levothyroxine (SYNTHROID) 50 MCG tablet, Take 1 tablet (50 mcg total) by mouth daily before breakfast., Disp: 90 tablet, Rfl: 3   Vilazodone HCl (VIIBRYD) 40 MG TABS, Take 1 tablet (40 mg total) by mouth daily., Disp: 90 tablet, Rfl: 1 Allergies  Allergen Reactions   Contrast Media [Iodinated Contrast Media] Hives     ROS: A complete ROS was performed with pertinent positives/negatives noted in the HPI. The remainder of the ROS are negative.    Objective:   Today's Vitals   02/21/24 1539  BP: 114/78  Pulse: 63  Temp: 97.9 F (36.6 C)  TempSrc: Temporal  SpO2: 98%  Weight: 156 lb 3.2 oz (70.9 kg)  Height: 5\' 10"  (1.778 m)    GENERAL: Well-appearing, in NAD. Well nourished.  SKIN: Pink, warm and dry. No rash, lesion, ulceration, or ecchymoses.  NECK: Trachea midline. Full ROM w/o pain or tenderness. No lymphadenopathy. No thyromegaly or palpable masses.  RESPIRATORY: Chest wall symmetrical. Respirations even and non-labored. Breath sounds clear to auscultation bilaterally.  CARDIAC: S1, S2 present, regular rate and rhythm. Peripheral pulses 2+ bilaterally.  EXTREMITIES: Without clubbing, cyanosis, or edema.  NEUROLOGIC:  Steady, even gait.  PSYCH/MENTAL STATUS: Alert, oriented x 3. Cooperative, appropriate mood and affect.    No results found for any visits on 02/21/24.    Assessment & Plan:  1. Hypothyroidism, unspecified type - levothyroxine (SYNTHROID) 50 MCG tablet; Take 1 tablet (50 mcg total) by mouth daily before breakfast.  Dispense: 90 tablet; Refill: 3 - TSH  Meds ordered this encounter  Medications   levothyroxine (SYNTHROID) 50 MCG tablet    Sig: Take 1 tablet (50 mcg total) by mouth daily before breakfast.     Dispense:  90 tablet    Refill:  3    Supervising Provider:   THOMPSON, AARON B [[id number]]   Orders Placed This Encounter  Procedures   TSH   Lab Orders         TSH     No images are attached to the encounter or orders placed in the encounter.  Return in about 5 months (around 07/23/2024) for Annual Physical Exam with fasting lab work.   Gavin Kast, FNP

## 2024-02-22 ENCOUNTER — Encounter: Payer: Self-pay | Admitting: Internal Medicine

## 2024-02-22 LAB — TSH: TSH: 2.85 u[IU]/mL (ref 0.35–5.50)

## 2024-04-16 ENCOUNTER — Other Ambulatory Visit: Payer: Self-pay | Admitting: Behavioral Health

## 2024-04-16 DIAGNOSIS — F330 Major depressive disorder, recurrent, mild: Secondary | ICD-10-CM

## 2024-04-16 DIAGNOSIS — F411 Generalized anxiety disorder: Secondary | ICD-10-CM

## 2024-04-19 ENCOUNTER — Other Ambulatory Visit: Payer: Self-pay | Admitting: Behavioral Health

## 2024-04-19 DIAGNOSIS — F411 Generalized anxiety disorder: Secondary | ICD-10-CM

## 2024-04-19 DIAGNOSIS — F331 Major depressive disorder, recurrent, moderate: Secondary | ICD-10-CM

## 2024-04-23 ENCOUNTER — Encounter: Payer: Self-pay | Admitting: Behavioral Health

## 2024-04-23 ENCOUNTER — Ambulatory Visit: Admitting: Behavioral Health

## 2024-04-23 DIAGNOSIS — F331 Major depressive disorder, recurrent, moderate: Secondary | ICD-10-CM

## 2024-04-23 DIAGNOSIS — F330 Major depressive disorder, recurrent, mild: Secondary | ICD-10-CM

## 2024-04-23 DIAGNOSIS — F411 Generalized anxiety disorder: Secondary | ICD-10-CM | POA: Diagnosis not present

## 2024-04-23 MED ORDER — VILAZODONE HCL 40 MG PO TABS: 40.0000 mg | ORAL_TABLET | Freq: Every day | ORAL | 1 refills | Status: DC

## 2024-04-23 MED ORDER — BUSPIRONE HCL 30 MG PO TABS: 30.0000 mg | ORAL_TABLET | Freq: Two times a day (BID) | ORAL | 3 refills | Status: AC

## 2024-04-23 MED ORDER — BUPROPION HCL ER (XL) 300 MG PO TB24: 300.0000 mg | ORAL_TABLET | Freq: Every day | ORAL | 1 refills | Status: DC

## 2024-04-23 MED ORDER — QUETIAPINE FUMARATE 50 MG PO TABS: 50.0000 mg | ORAL_TABLET | Freq: Every day | ORAL | 0 refills | Status: DC

## 2024-04-23 NOTE — Progress Notes (Signed)
 [facility]  Patient ID: Matthew Novak,  MRN: [id number]  PCP: Gavin Kast, FNP  Date of Evaluation: 04/23/2024 Time spent:20 minutes  Chief Complaint:  Chief Complaint   Depression; Anxiety; Follow-up; Patient Education; Medication Refill     HISTORY/CURRENT STATUS: HPI 48 year old male presents to this office for follow up  and medication management. No social changes this visit. Says that he has had moderate improvement with anxiety but great with depression. Say that his condition is manageable  right now.  Endorses daily Cannabis use mostly at bedtime for anxiety and sleep.  Says his sleep is good right now. He says his anxiety today is 3/10 and depression is 1/10.  He denies mania, no psychosis, no SI/HI.   Past psychiatric medication trial list; Cymbalta  Celexa Wellbutrin  Seroquel  Effexor Valium  Lexapro  Individual Medical History/ Review of Systems: Changes? :No   Allergies: Contrast media [iodinated contrast media]  Current Medications:  Current Outpatient Medications:    buPROPion  (WELLBUTRIN  XL) 300 MG 24 hr tablet, Take 1 tablet (300 mg total) by mouth daily., Disp: 90 tablet, Rfl: 1   busPIRone  (BUSPAR ) 30 MG tablet, Take 1 tablet (30 mg total) by mouth 2 (two) times daily., Disp: 180 tablet, Rfl: 3   diclofenac (VOLTAREN) 75 MG EC tablet, Take 75 mg by mouth 2 (two) times daily., Disp: , Rfl:    levothyroxine  (SYNTHROID ) 50 MCG tablet, Take 1 tablet (50 mcg total) by mouth daily before breakfast., Disp: 90 tablet, Rfl: 3   QUEtiapine  (SEROQUEL ) 50 MG tablet, Take 1 tablet (50 mg total) by mouth at bedtime., Disp: 30 tablet, Rfl: 0   rosuvastatin  (CRESTOR ) 10 MG tablet, TAKE 1 TABLET(10 MG) BY MOUTH DAILY, Disp: 90 tablet, Rfl: 1   Vilazodone  HCl (VIIBRYD ) 40 MG TABS, TAKE 1 TABLET(40 MG) BY MOUTH DAILY, Disp: 30 tablet, Rfl: 3   Vilazodone  HCl (VIIBRYD ) 40 MG TABS, TAKE 1 TABLET(40 MG) BY MOUTH DAILY, Disp: 30 tablet, Rfl: 0   Vilazodone  HCl  (VIIBRYD ) 40 MG TABS, Take 1 tablet (40 mg total) by mouth daily., Disp: 90 tablet, Rfl: 1 Medication Side Effects: none  Family Medical/ Social History: Changes? No  MENTAL HEALTH EXAM:  There were no vitals taken for this visit.There is no height or weight on file to calculate BMI.  General Appearance: Casual, Neat, and Well Groomed  Eye Contact:  Good  Speech:  Clear and Coherent  Volume:  Normal  Mood:  NA  Affect:  Appropriate  Thought Process:  Coherent  Orientation:  Full (Time, Place, and Person)  Thought Content: Logical   Suicidal Thoughts:  No  Homicidal Thoughts:  No  Memory:  WNL  Judgement:  Good  Insight:  Good  Psychomotor Activity:  Normal  Concentration:  Concentration: Good  Recall:  Good  Fund of Knowledge: Good  Language: Good  Assets:  Desire for Improvement  ADL's:  Intact  Cognition: WNL  Prognosis:  Good    DIAGNOSES:    ICD-10-CM   1. Generalized anxiety disorder  F41.1 busPIRone  (BUSPAR ) 30 MG tablet    Vilazodone  HCl (VIIBRYD ) 40 MG TABS    buPROPion  (WELLBUTRIN  XL) 300 MG 24 hr tablet    QUEtiapine  (SEROQUEL ) 50 MG tablet    2. Mild episode of recurrent major depressive disorder (HCC)  F33.0 Vilazodone  HCl (VIIBRYD ) 40 MG TABS    buPROPion  (WELLBUTRIN  XL) 300 MG 24 hr tablet    3. Major depressive disorder, recurrent episode, moderate (HCC)  F33.1  QUEtiapine  (SEROQUEL ) 50 MG tablet      Receiving Psychotherapy: No    RECOMMENDATIONS:  Greater than 50% of 20  min face to face time with patient was spent on counseling and coordination of care. Discussed his significant level improvement with depression but moderate with anxiety.  Reports consistent stability over last 6 months. Requesting no medication changes this visit.   Agree to:  Continue Buspar  to 30 mg twice daily To continue Seroquel  50 mg at bedtime for sleep Continue Vybriid 40 mg daily Continue  Wellbutrin  300 mg daily in the am Will report side effects or worsening  symptoms To follow up in 6 months to reassess Provided emergency contact information Discussed potential metabolic side effects associated with atypical antipsychotics, as well as potential risk for movement side effects. Advised pt to contact office if movement side effects occur.  Reviewed PDMP       Matthew Renshaw, NP

## 2024-05-21 ENCOUNTER — Other Ambulatory Visit: Payer: Self-pay | Admitting: Behavioral Health

## 2024-05-21 DIAGNOSIS — F411 Generalized anxiety disorder: Secondary | ICD-10-CM

## 2024-05-21 DIAGNOSIS — F331 Major depressive disorder, recurrent, moderate: Secondary | ICD-10-CM

## 2024-07-20 ENCOUNTER — Other Ambulatory Visit: Payer: Self-pay | Admitting: Internal Medicine

## 2024-07-20 DIAGNOSIS — E785 Hyperlipidemia, unspecified: Secondary | ICD-10-CM

## 2024-07-24 ENCOUNTER — Encounter: Payer: BC Managed Care – PPO | Admitting: Internal Medicine

## 2024-07-25 ENCOUNTER — Encounter: Admitting: Internal Medicine

## 2024-07-29 ENCOUNTER — Encounter: Payer: Self-pay | Admitting: Internal Medicine

## 2024-07-29 ENCOUNTER — Ambulatory Visit: Payer: Self-pay | Admitting: Internal Medicine

## 2024-07-29 ENCOUNTER — Ambulatory Visit: Admitting: Internal Medicine

## 2024-07-29 VITALS — BP 114/78 | HR 76 | Temp 98.2°F | Ht 69.0 in | Wt 150.4 lb

## 2024-07-29 DIAGNOSIS — E785 Hyperlipidemia, unspecified: Secondary | ICD-10-CM

## 2024-07-29 DIAGNOSIS — Z23 Encounter for immunization: Secondary | ICD-10-CM | POA: Diagnosis not present

## 2024-07-29 DIAGNOSIS — Z0000 Encounter for general adult medical examination without abnormal findings: Secondary | ICD-10-CM

## 2024-07-29 DIAGNOSIS — Z125 Encounter for screening for malignant neoplasm of prostate: Secondary | ICD-10-CM

## 2024-07-29 LAB — COMPREHENSIVE METABOLIC PANEL WITH GFR
ALT: 13 U/L (ref 0–53)
AST: 22 U/L (ref 0–37)
Albumin: 4.6 g/dL (ref 3.5–5.2)
Alkaline Phosphatase: 61 U/L (ref 39–117)
BUN: 15 mg/dL (ref 6–23)
CO2: 31 meq/L (ref 19–32)
Calcium: 9.7 mg/dL (ref 8.4–10.5)
Chloride: 101 meq/L (ref 96–112)
Creatinine, Ser: 0.93 mg/dL (ref 0.40–1.50)
GFR: 95.73 mL/min (ref >60.00)
Glucose, Bld: 90 mg/dL (ref 70–99)
Potassium: 4.5 meq/L (ref 3.5–5.1)
Sodium: 137 meq/L (ref 135–145)
Total Bilirubin: 0.4 mg/dL (ref 0.2–1.2)
Total Protein: 6.5 g/dL (ref 6.0–8.3)

## 2024-07-29 LAB — CBC WITH DIFFERENTIAL/PLATELET
Basophils Absolute: 0.1 K/uL (ref 0.0–0.1)
Basophils Relative: 0.8 % (ref 0.0–3.0)
Eosinophils Absolute: 0.3 K/uL (ref 0.0–0.7)
Eosinophils Relative: 4.3 % (ref 0.0–5.0)
HCT: 42.9 % (ref 39.0–52.0)
Hemoglobin: 14.3 g/dL (ref 13.0–17.0)
Lymphocytes Relative: 25.4 % (ref 12.0–46.0)
Lymphs Abs: 2 K/uL (ref 0.7–4.0)
MCHC: 33.3 g/dL (ref 30.0–36.0)
MCV: 91.8 fl (ref 78.0–100.0)
Monocytes Absolute: 0.7 K/uL (ref 0.1–1.0)
Monocytes Relative: 8.6 % (ref 3.0–12.0)
Neutro Abs: 4.7 K/uL (ref 1.4–7.7)
Neutrophils Relative %: 60.9 % (ref 43.0–77.0)
Platelets: 227 K/uL (ref 150.0–400.0)
RBC: 4.67 Mil/uL (ref 4.22–5.81)
RDW: 12.8 % (ref 11.5–15.5)
WBC: 7.7 K/uL (ref 4.0–10.5)

## 2024-07-29 LAB — TSH: TSH: 1.96 u[IU]/mL (ref 0.35–5.50)

## 2024-07-29 LAB — LIPID PANEL
Cholesterol: 151 mg/dL (ref 0–200)
HDL: 40.8 mg/dL (ref >39.00)
LDL Cholesterol: 96 mg/dL (ref 0–99)
NonHDL: 109.95
Total CHOL/HDL Ratio: 4
Triglycerides: 71 mg/dL (ref 0.0–149.0)
VLDL: 14.2 mg/dL (ref 0.0–40.0)

## 2024-07-29 LAB — PSA: PSA: 0.6 ng/mL (ref 0.10–4.00)

## 2024-07-29 MED ORDER — ROSUVASTATIN CALCIUM 10 MG PO TABS: 10.0000 mg | ORAL_TABLET | Freq: Every day | ORAL | 3 refills | Status: AC

## 2024-07-29 NOTE — Patient Instructions (Signed)
 Amlactin cream for dry skin on elbow

## 2024-07-29 NOTE — Progress Notes (Signed)
 Subjective:   BRINDEN KINCHELOE Sep 05, 1976 07/29/2024  CC: Chief Complaint  Patient presents with   Annual Exam    Pt is fasting, no concerns    HPI: JAMAAR HOWES is a 48 y.o. male who presents for a routine health maintenance exam.  Labs collected at time of visit.   Needs refill of rosuvastatin  for HLD.   HEALTH SCREENINGS: - PSA (50+): Ordered today   Lab Results  Component Value Date   PSA 0.41 07/14/2022   PSA 0.52 04/14/2016    - Colonoscopy (45+): Up to date - due 2030 - AAA Screening: Not applicable  Men age 65-75 who have ever smoked - Lung Cancer screening with low-dose CT: Not applicable- 15 pp year history  Adults age 14-80 who are current cigarette smokers or quit within the last 15 years. Must have 20 pack year history.   IMMUNIZATIONS:  - Tdap: Tetanus vaccination status reviewed: last tetanus booster within 10 years. - Influenza: Administered today - Prevnar: postponed today  due to medical decision - Zostavax vaccine (50+): Administered today   Past medical history, surgical history, medications, allergies, family history and social history reviewed with patient today and changes made to appropriate areas of the chart.   Social History   Socioeconomic History   Marital status: Married    Spouse name: Bari   Number of children: 1   Years of education: 12   Highest education level: Associate degree: occupational, Scientist, product/process development, or vocational program  Occupational History   Occupation: Merchandiser, retail  Tobacco Use   Smoking status: Former    Current packs/day: 0.50    Average packs/day: 0.5 packs/day for 30.0 years (15.0 ttl pk-yrs)    Types: Cigarettes   Smokeless tobacco: Never  Vaping Use   Vaping status: Some Days  Substance and Sexual Activity   Alcohol use: Not Currently    Comment: 13 years sober   Drug use: Yes    Types: Marijuana    Comment: daily   Sexual activity: Yes    Birth control/protection: None  Other Topics Concern    Not on file  Social History Narrative   Lives in [place] with wife and 23 year old.    Social Drivers of Corporate investment banker Strain: Low Risk  (07/28/2024)   Overall Financial Resource Strain (CARDIA)    Difficulty of Paying Living Expenses: Not hard at all  Food Insecurity: No Food Insecurity (07/28/2024)   Hunger Vital Sign    Worried About Running Out of Food in the Last Year: Never true    Ran Out of Food in the Last Year: Never true  Transportation Needs: No Transportation Needs (07/28/2024)   PRAPARE - Administrator, Civil Service (Medical): No    Lack of Transportation (Non-Medical): No  Physical Activity: Unknown (07/28/2024)   Exercise Vital Sign    Days of Exercise per Week: 7 days    Minutes of Exercise per Session: Patient declined  Stress: Patient Declined (07/28/2024)   [employer] of Occupational Health - Occupational Stress Questionnaire    Feeling of Stress: Patient declined  Social Connections: Unknown (07/28/2024)   Social Connection and Isolation Panel    Frequency of Communication with Friends and Family: Patient declined    Frequency of Social Gatherings with Friends and Family: Patient declined    Attends Religious Services: Never    Database administrator or Organizations: No    Attends Engineer, structural: Not  on file    Marital Status: Married  Catering manager Violence: Not on file    Past Medical History:  Diagnosis Date   Allergy    Anxiety    Arthritis    Depression    Hyperlipidemia    Hypertension    stop drinking ETOH- no longer needs meds or has high BP   Hypothyroidism    Substance abuse (HCC)     Past Surgical History:  Procedure Laterality Date   CARPAL TUNNEL WITH CUBITAL TUNNEL Right    CERVICAL DISC ARTHROPLASTY     COLONOSCOPY WITH PROPOFOL  N/A 02/24/2022   Procedure: COLONOSCOPY WITH PROPOFOL ;  Surgeon: Aneita Gwendlyn DASEN, MD;  Location: [facility];  Service: Endoscopy;  Laterality: N/A;    FEMORAL ARTERY EXPLORATION Right 05/02/2016   Procedure: RIGHT GROIN EVACUATION HEMATOMA WITH BYPASS OF RIGHT SUPERFICIAL FEMORAL ARTERY USING SAPHENOUS VEIN;  Surgeon: Krystal JULIANNA Doing, MD;  Location: [facility] OR;  Service: Vascular;  Laterality: Right;   GRAFT APPLICATION Left 09/09/2019   Procedure: INTEGRA GRAFT APPLICATION;  Surgeon: Carolee Lynwood JINNY DOUGLAS, MD;  Location: [facility];  Service: Orthopedics;  Laterality: Left;   INCISION AND DRAINAGE OF WOUND Left 09/09/2019   Procedure: Left thumb irrigation and debridement with integra application and surgery as indicated;  Surgeon: Carolee Lynwood JINNY DOUGLAS, MD;  Location: [facility];  Service: Orthopedics;  Laterality: Left;    MOUTH SURGERY     POLYPECTOMY  02/24/2022   Procedure: POLYPECTOMY;  Surgeon: Aneita Gwendlyn DASEN, MD;  Location: [facility];  Service: Endoscopy;;   SPINE SURGERY  2016   No   ulnar nerve Right     Current Outpatient Medications on File Prior to Visit  Medication Sig   buPROPion  (WELLBUTRIN  XL) 300 MG 24 hr tablet Take 1 tablet (300 mg total) by mouth daily.   busPIRone  (BUSPAR ) 30 MG tablet Take 1 tablet (30 mg total) by mouth 2 (two) times daily.   diclofenac (VOLTAREN) 75 MG EC tablet Take 75 mg by mouth 2 (two) times daily.   levothyroxine  (SYNTHROID ) 50 MCG tablet Take 1 tablet (50 mcg total) by mouth daily before breakfast.   naproxen  sodium (ALEVE ) 220 MG tablet Take 220 mg by mouth daily.   QUEtiapine  (SEROQUEL ) 50 MG tablet TAKE 1 TABLET(50 MG) BY MOUTH AT BEDTIME   Vilazodone  HCl (VIIBRYD ) 40 MG TABS TAKE 1 TABLET(40 MG) BY MOUTH DAILY   Vilazodone  HCl (VIIBRYD ) 40 MG TABS TAKE 1 TABLET(40 MG) BY MOUTH DAILY   Vilazodone  HCl (VIIBRYD ) 40 MG TABS Take 1 tablet (40 mg total) by mouth daily.   No current facility-administered medications on file prior to visit.    Allergies  Allergen Reactions   Contrast Media [Iodinated Contrast Media] Hives    Family History  Problem  Relation Age of Onset   Depression Mother    Anxiety disorder Mother    Hypertension Mother    Alcohol abuse Father    Depression Father    Anxiety disorder Father    Heart disease Father    Hypertension Father    Alcohol abuse Brother    Depression Brother    Anxiety disorder Brother    Hypertension Brother    Colon cancer Neg Hx    Esophageal cancer Neg Hx    Rectal cancer Neg Hx    Stomach cancer Neg Hx      ROS: Denies fever, fatigue, unexplained weight loss/gain, hearing changes, cardiac or respiratory complaints. Denies neurological  deficits, musculoskeletal complaints, gastrointestinal or genitourinary complaints, mental health complaints, and skin changes. Has noticed some decreased vision with close up objects - uses reading glasses which helps.   Objective:   Today's Vitals   07/29/24 1010  BP: 114/78  Pulse: 76  Temp: 98.2 F (36.8 C)  TempSrc: Temporal  SpO2: 98%  Weight: 150 lb 6.4 oz (68.2 kg)  Height: 5' 9 (1.753 m)    GENERAL APPEARANCE: Well-appearing, in NAD. Well nourished.  SKIN: Pink, warm and dry. Turgor normal. No rash, lesion, ulceration, or ecchymoses. Dry skin to left elbow. Hair evenly distributed.  HEENT: HEAD: Normocephalic.  EYES: PERRLA. EOMI. Lids intact w/o defect. Sclera white, Conjunctiva pink w/o exudate.  EARS: External ear w/o redness, swelling, masses or lesions. EAC clear. TM's intact, translucent w/o bulging, appropriate landmarks visualized. Appropriate acuity to conversational tones.  NOSE: Septum midline w/o deformity. Nares patent, mucosa pink and non-inflamed w/o drainage.  THROAT: Uvula midline. Oropharynx clear. Tonsils non-inflamed w/o exudate. Oral mucosa pink and moist.  NECK: Supple, Trachea midline. Full ROM w/o pain or tenderness. No lymphadenopathy. Thyroid  non-tender w/o enlargement or palpable masses.  RESPIRATORY: Chest wall symmetrical w/o masses. Respirations even and non-labored. Breath sounds clear to  auscultation bilaterally. No wheezes, rales, rhonchi, or crackles. CARDIAC: S1, S2 present, regular rate and rhythm. No gallops, murmurs, rubs, or clicks. No carotid bruits. Capillary refill <2 seconds. Peripheral pulses 2+ bilaterally. GI: Abdomen soft w/o distention. Normoactive bowel sounds. No palpable masses or tenderness. No guarding or rebound tenderness. Liver and spleen w/o tenderness or enlargement. No CVA tenderness.  GU: deferred exam. MSK: Muscle tone and strength appropriate for age, w/o atrophy or abnormal movement. EXTREMITIES: Active ROM intact, w/o tenderness, crepitus, or contracture. No obvious joint deformities or effusions. No clubbing, edema, or cyanosis.  NEUROLOGIC: CN's II-XII intact. Motor strength symmetrical with no obvious weakness. No sensory deficits. Steady, even gait.  PSYCH/MENTAL STATUS: Alert, oriented x 3. Cooperative, appropriate mood and affect.    Depression and Anxiety Screen done today and results listed below:     07/29/2024   10:08 AM 02/21/2024    3:38 PM 07/24/2023    9:32 AM 07/18/2022   10:58 AM 12/14/2021    2:28 PM  Depression screen PHQ 2/9  Decreased Interest 3 0 3 3   Down, Depressed, Hopeless 3 0 2 3   PHQ - 2 Score 6 0 5 6   Altered sleeping 1  0 1   Tired, decreased energy 2  3 1    Change in appetite 3  3 1    Feeling bad or failure about yourself  1  1 0   Trouble concentrating 1  3 1    Moving slowly or fidgety/restless 0  0 0   Suicidal thoughts 0  0 0   PHQ-9 Score 14  15 10    Difficult doing work/chores Somewhat difficult  Not difficult at all Not difficult at all      Information is confidential and restricted. Go to Review Flowsheets to unlock data.      07/29/2024   10:08 AM 07/24/2023    9:32 AM  GAD 7 : Generalized Anxiety Score  Nervous, Anxious, on Edge 3 3  Control/stop worrying 3 3  Worry too much - different things 3 3  Trouble relaxing  3  Restless 3 3  Easily annoyed or irritable 3 1  Afraid - awful might  happen 3 3  Total GAD 7 Score  19  Anxiety Difficulty  Not difficult at all Not difficult at all    Assessment & Plan:  Encounter for general adult medical examination without abnormal findings -     CBC with Differential/Platelet -     Comprehensive metabolic panel with GFR -     TSH -     Lipid panel  Prostate cancer screening -     PSA  Hyperlipidemia, unspecified hyperlipidemia type -     Rosuvastatin  Calcium ; Take 1 tablet (10 mg total) by mouth daily.  Dispense: 90 tablet; Refill: 3  Immunization due -     Flu vaccine trivalent PF, 6mos and older(Flulaval,Afluria,Fluarix,Fluzone) -     Varicella-zoster vaccine IM     Orders Placed This Encounter  Procedures   Flu vaccine trivalent PF, 6mos and older(Flulaval,Afluria,Fluarix,Fluzone)   Zoster, Recombinant (Shingrix )   CBC with Differential/Platelet   Comprehensive metabolic panel with GFR   TSH   Lipid panel   PSA    PATIENT COUNSELING: - Encourage to adjust caloric intake to maintain or achieve ideal body weight, to reduce intake of dietary saturated fat and total fat, to limit sodium intake by avoiding high sodium foods and not adding table salt, and to maintain adequate dietary potassium and calcium  preferably from fresh fruits, vegetables, and low-fat dairy products.   - Advised to avoid cigarette smoking.  - importance of regular exercise  NEXT PREVENTATIVE PHYSICAL DUE IN 1 YEAR.  Return in about 3 months (around 10/28/2024) for 2nd shingles vaccine (nurse visit only) , AND 1 year for annual physical with fasting lab work. ..  Rosina Senters, FNP

## 2024-10-23 ENCOUNTER — Encounter: Payer: Self-pay | Admitting: Behavioral Health

## 2024-10-23 ENCOUNTER — Ambulatory Visit: Admitting: Behavioral Health

## 2024-10-23 DIAGNOSIS — F331 Major depressive disorder, recurrent, moderate: Secondary | ICD-10-CM | POA: Diagnosis not present

## 2024-10-23 DIAGNOSIS — F5105 Insomnia due to other mental disorder: Secondary | ICD-10-CM | POA: Diagnosis not present

## 2024-10-23 DIAGNOSIS — F411 Generalized anxiety disorder: Secondary | ICD-10-CM | POA: Diagnosis not present

## 2024-10-23 DIAGNOSIS — F99 Mental disorder, not otherwise specified: Secondary | ICD-10-CM

## 2024-10-23 MED ORDER — LUMATEPERONE TOSYLATE 42 MG PO CAPS: 42.0000 mg | ORAL_CAPSULE | Freq: Every day | ORAL | 1 refills | Status: DC

## 2024-10-23 NOTE — Progress Notes (Signed)
 [facility]  Patient ID: Matthew Novak,  MRN: [id number]  PCP: Billy Knee, FNP  Date of Evaluation: 10/23/2024 Time spent:30 minutes  Chief Complaint:   HISTORY/CURRENT STATUS: HPI 48 year old male presents to this office for follow up  and medication management.  Patient reports anxiety and depression at 5/10.  His PHQ-9 today was 8.  Patient states that he would like to try a new medication to help.  He has tried multiple antidepressants in the past.   Endorses daily Cannabis use mostly at bedtime for anxiety and sleep.  Right now getting about 6 hours per night.  He says his anxiety today is 5/10 and depression is 5/10.  He denies mania, no psychosis, no SI/HI.   Past psychiatric medication trial list; Cymbalta  Celexa Wellbutrin  Seroquel  Effexor Valium  Lexapro  Abilify    Individual Medical History/ Review of Systems: Changes? :No   Allergies: Contrast media [iodinated contrast media]  Current Medications: Current Medications[1] Medication Side Effects: none  Family Medical/ Social History: Changes? No  MENTAL HEALTH EXAM:  There were no vitals taken for this visit.There is no height or weight on file to calculate BMI.  General Appearance: Casual, Neat, and Well Groomed  Eye Contact:  Good  Speech:  Clear and Coherent  Volume:  Normal  Mood:  Anxious and Depressed  Affect:  Depressed, Flat, and Anxious  Thought Process:  Coherent  Orientation:  Full (Time, Place, and Person)  Thought Content: Logical   Suicidal Thoughts:  No  Homicidal Thoughts:  No  Memory:  WNL  Judgement:  NA  Insight:  Good  Psychomotor Activity:  Normal  Concentration:  Concentration: Good  Recall:  Good  Fund of Knowledge: Good  Language: Good  Assets:  Desire for Improvement  ADL's:  Intact  Cognition: WNL  Prognosis:  Good    DIAGNOSES:    ICD-10-CM   1. Generalized anxiety disorder  F41.1 lumateperone  tosylate (CAPLYTA ) 42 MG capsule    2. Major  depressive disorder, recurrent episode, moderate (HCC)  F33.1 lumateperone  tosylate (CAPLYTA ) 42 MG capsule    3. Insomnia due to other mental disorder  F51.05    F99       Receiving Psychotherapy: No    RECOMMENDATIONS:  Greater than 50% of 20  min face to face time with patient was spent on counseling and coordination of care. Discussed his significant level improvement with depression but moderate with anxiety.  Reports consistent stability over last 6 months. Requesting no medication changes this visit.    Agree to:  Continue Buspar  to 30 mg twice daily To continue Seroquel  50 mg at bedtime for sleep Continue Vybriid 40 mg daily Continue  Wellbutrin  300 mg daily in the am To start Caplyta  42 mg daily at bedtime. Two weeks samples provided with RX drug card.  Caplyta  is now FDA approved for adjunctive therapy in treating MDD.  Patient has had 2 previous trials of atypicals and multiple trials of antidepressants. Will report side effects or worsening symptoms To follow up in 4 weeks to reassess Provided emergency contact information Discussed potential metabolic side effects associated with atypical antipsychotics, as well as potential risk for movement side effects. Advised pt to contact office if movement side effects occur.  Reviewed PDMP       Redell DELENA Pizza, NP     [1]  Current Outpatient Medications:    lumateperone  tosylate (CAPLYTA ) 42 MG capsule, Take 1 capsule (42 mg total) by mouth daily., Disp:  30 capsule, Rfl: 1   buPROPion  (WELLBUTRIN  XL) 300 MG 24 hr tablet, Take 1 tablet (300 mg total) by mouth daily., Disp: 90 tablet, Rfl: 1   busPIRone  (BUSPAR ) 30 MG tablet, Take 1 tablet (30 mg total) by mouth 2 (two) times daily., Disp: 180 tablet, Rfl: 3   diclofenac (VOLTAREN) 75 MG EC tablet, Take 75 mg by mouth 2 (two) times daily., Disp: , Rfl:    levothyroxine  (SYNTHROID ) 50 MCG tablet, Take 1 tablet (50 mcg total) by mouth daily before breakfast., Disp: 90 tablet, Rfl:  3   naproxen  sodium (ALEVE ) 220 MG tablet, Take 220 mg by mouth daily., Disp: , Rfl:    QUEtiapine  (SEROQUEL ) 50 MG tablet, TAKE 1 TABLET(50 MG) BY MOUTH AT BEDTIME, Disp: 90 tablet, Rfl: 1   rosuvastatin  (CRESTOR ) 10 MG tablet, Take 1 tablet (10 mg total) by mouth daily., Disp: 90 tablet, Rfl: 3   Vilazodone  HCl (VIIBRYD ) 40 MG TABS, TAKE 1 TABLET(40 MG) BY MOUTH DAILY, Disp: 30 tablet, Rfl: 3   Vilazodone  HCl (VIIBRYD ) 40 MG TABS, TAKE 1 TABLET(40 MG) BY MOUTH DAILY, Disp: 30 tablet, Rfl: 0   Vilazodone  HCl (VIIBRYD ) 40 MG TABS, Take 1 tablet (40 mg total) by mouth daily., Disp: 90 tablet, Rfl: 1

## 2024-10-25 ENCOUNTER — Telehealth: Payer: Self-pay

## 2024-10-25 NOTE — Telephone Encounter (Signed)
 PA Caplyta  42 mg [employer]

## 2024-10-28 NOTE — Telephone Encounter (Signed)
 PA approved 10/25/24-10/25/25 with [employer] Caplyta  42 mg

## 2024-10-29 ENCOUNTER — Ambulatory Visit (INDEPENDENT_AMBULATORY_CARE_PROVIDER_SITE_OTHER)

## 2024-10-29 DIAGNOSIS — Z23 Encounter for immunization: Secondary | ICD-10-CM | POA: Diagnosis not present

## 2024-10-29 NOTE — Progress Notes (Signed)
 Patient is in office today for a nurse visit for a second Shingrix  Immunization.  Injection was given in the left deltoid by Laymon Gladis Sharps, CMA. Patient tolerated injection well.

## 2024-11-14 ENCOUNTER — Other Ambulatory Visit: Payer: Self-pay | Admitting: Behavioral Health

## 2024-11-14 DIAGNOSIS — F411 Generalized anxiety disorder: Secondary | ICD-10-CM

## 2024-11-14 DIAGNOSIS — F330 Major depressive disorder, recurrent, mild: Secondary | ICD-10-CM

## 2024-11-14 DIAGNOSIS — F331 Major depressive disorder, recurrent, moderate: Secondary | ICD-10-CM

## 2024-11-25 ENCOUNTER — Encounter: Payer: Self-pay | Admitting: Behavioral Health

## 2024-11-25 ENCOUNTER — Ambulatory Visit: Admitting: Behavioral Health

## 2024-11-25 DIAGNOSIS — F331 Major depressive disorder, recurrent, moderate: Secondary | ICD-10-CM | POA: Diagnosis not present

## 2024-11-25 DIAGNOSIS — F411 Generalized anxiety disorder: Secondary | ICD-10-CM

## 2024-11-25 DIAGNOSIS — F5105 Insomnia due to other mental disorder: Secondary | ICD-10-CM

## 2024-11-25 DIAGNOSIS — F330 Major depressive disorder, recurrent, mild: Secondary | ICD-10-CM

## 2024-11-25 DIAGNOSIS — F99 Mental disorder, not otherwise specified: Secondary | ICD-10-CM

## 2024-11-25 MED ORDER — LUMATEPERONE TOSYLATE 42 MG PO CAPS: 42.0000 mg | ORAL_CAPSULE | Freq: Every day | ORAL | 1 refills | Status: AC

## 2024-11-25 NOTE — Progress Notes (Unsigned)
 "     [facility]  Patient ID: Matthew Novak,  MRN: [id number]  PCP: Billy Knee, FNP  Date of Evaluation: 11/25/2024 Time spent: 20 min  Chief Complaint:  Chief Complaint   Depression; Anxiety; Follow-up; Patient Education; Medication Refill     HISTORY/CURRENT STATUS: HPI 49 year old male presents to this office for follow up  and medication management.  Patient reports anxiety and depression at 5/10.  His PHQ-9 today was 8.  Patient states that he would like to try a new medication to help.  He has tried multiple antidepressants in the past.   Endorses daily Cannabis use mostly at bedtime for anxiety and sleep.  Right now getting about 6 hours per night.  He says his anxiety today is 5/10 and depression is 5/10.  He denies mania, no psychosis, no SI/HI.   Past psychiatric medication trial list; Cymbalta  Celexa Wellbutrin  Seroquel  Effexor Valium  Lexapro  Abilify Individual Medical History/ Review of Systems: Changes? :No   Allergies: Contrast media [iodinated contrast media]  Current Medications: Current Medications[1] Medication Side Effects: none  Family Medical/ Social History: Changes? No  MENTAL HEALTH EXAM:  There were no vitals taken for this visit.There is no height or weight on file to calculate BMI.  General Appearance: Casual, Neat, and Well Groomed  Eye Contact:  Good  Speech:  Clear and Coherent  Volume:  Normal  Mood:  NA  Affect:  Appropriate  Thought Process:  Coherent  Orientation:  Full (Time, Place, and Person)  Thought Content: Logical   Suicidal Thoughts:  No  Homicidal Thoughts:  No  Memory:  WNL  Judgement:  Good  Insight:  Good  Psychomotor Activity:  Normal  Concentration:  Concentration: Good  Recall:  Good  Fund of Knowledge: Good  Language: Good  Assets:  Desire for Improvement  ADL's:  Intact  Cognition: WNL  Prognosis:  Good    DIAGNOSES:    ICD-10-CM   1. Generalized anxiety disorder  F41.1     2. Mild  episode of recurrent major depressive disorder  F33.0     3. Major depressive disorder, recurrent episode, moderate (HCC)  F33.1     4. Insomnia due to other mental disorder  F51.05    F99       Receiving Psychotherapy: No    RECOMMENDATIONS:    Greater than 50% of 20  min face to face time with patient was spent on counseling and coordination of care. Discussed his significant level improvement with depression but moderate with anxiety.  Reports consistent stability over last 6 months. Requesting no medication changes this visit.    Agree to:  Continue Buspar  to 30 mg twice daily To continue Seroquel  50 mg at bedtime for sleep Continue Vybriid 40 mg daily Continue  Wellbutrin  300 mg daily in the am To start Caplyta  42 mg daily at bedtime. Two weeks samples provided with RX drug card.  Caplyta  is now FDA approved for adjunctive therapy in treating MDD.  Patient has had 2 previous trials of atypicals and multiple trials of antidepressants. Will report side effects or worsening symptoms To follow up in 4 weeks to reassess Provided emergency contact information Discussed potential metabolic side effects associated with atypical antipsychotics, as well as potential risk for movement side effects. Advised pt to contact office if movement side effects occur.  Reviewed PDMP       Redell DELENA Pizza, NP     [1]  Current Outpatient Medications:  buPROPion  (WELLBUTRIN  XL) 300 MG 24 hr tablet, TAKE 1 TABLET(300 MG) BY MOUTH DAILY, Disp: 90 tablet, Rfl: 1   busPIRone  (BUSPAR ) 30 MG tablet, Take 1 tablet (30 mg total) by mouth 2 (two) times daily., Disp: 180 tablet, Rfl: 3   diclofenac (VOLTAREN) 75 MG EC tablet, Take 75 mg by mouth 2 (two) times daily., Disp: , Rfl:    levothyroxine  (SYNTHROID ) 50 MCG tablet, Take 1 tablet (50 mcg total) by mouth daily before breakfast., Disp: 90 tablet, Rfl: 3   lumateperone  tosylate (CAPLYTA ) 42 MG capsule, Take 1 capsule (42 mg total) by mouth daily.,  Disp: 30 capsule, Rfl: 1   naproxen  sodium (ALEVE ) 220 MG tablet, Take 220 mg by mouth daily., Disp: , Rfl:    QUEtiapine  (SEROQUEL ) 50 MG tablet, TAKE 1 TABLET(50 MG) BY MOUTH AT BEDTIME, Disp: 90 tablet, Rfl: 1   rosuvastatin  (CRESTOR ) 10 MG tablet, Take 1 tablet (10 mg total) by mouth daily., Disp: 90 tablet, Rfl: 3   Vilazodone  HCl (VIIBRYD ) 40 MG TABS, TAKE 1 TABLET(40 MG) BY MOUTH DAILY, Disp: 30 tablet, Rfl: 3   Vilazodone  HCl (VIIBRYD ) 40 MG TABS, TAKE 1 TABLET(40 MG) BY MOUTH DAILY, Disp: 30 tablet, Rfl: 0   Vilazodone  HCl (VIIBRYD ) 40 MG TABS, TAKE 1 TABLET(40 MG) BY MOUTH DAILY, Disp: 90 tablet, Rfl: 1  "

## 2025-01-06 ENCOUNTER — Ambulatory Visit: Admitting: Behavioral Health

## 2025-07-31 ENCOUNTER — Encounter: Admitting: Internal Medicine
